# Patient Record
Sex: Female | Born: 1965 | Race: White | Hispanic: No | Marital: Married | Smoking: Current every day smoker
Health system: Southern US, Community
[De-identification: ages and names within clinical notes are randomized; demographics above are authoritative.]

## PROBLEM LIST (undated history)

## (undated) DIAGNOSIS — K259 Gastric ulcer, unspecified as acute or chronic, without hemorrhage or perforation: Secondary | ICD-10-CM

## (undated) DIAGNOSIS — G43909 Migraine, unspecified, not intractable, without status migrainosus: Secondary | ICD-10-CM

## (undated) DIAGNOSIS — I10 Essential (primary) hypertension: Secondary | ICD-10-CM

## (undated) DIAGNOSIS — J439 Emphysema, unspecified: Secondary | ICD-10-CM

## (undated) HISTORY — DX: Essential (primary) hypertension: I10

## (undated) HISTORY — DX: Migraine, unspecified, not intractable, without status migrainosus: G43.909

## (undated) HISTORY — DX: Emphysema, unspecified: J43.9

---

## 2010-09-09 ENCOUNTER — Observation Stay (HOSPITAL_COMMUNITY)
Admission: EM | Admit: 2010-09-09 | Discharge: 2010-09-10 | Payer: Self-pay | Source: Home / Self Care | Admitting: Emergency Medicine

## 2010-10-23 ENCOUNTER — Ambulatory Visit (HOSPITAL_COMMUNITY): Admission: RE | Admit: 2010-10-23 | Payer: Self-pay | Source: Home / Self Care | Admitting: Preventative Medicine

## 2010-11-14 ENCOUNTER — Encounter: Payer: Self-pay | Admitting: Preventative Medicine

## 2010-12-12 NOTE — Discharge Summary (Signed)
  NAMECHERRI, Madison NO.:  [id number]  MEDICAL RECORD NO.:  [id number]          PATIENT TYPE:  OBV  LOCATION:  A206                          FACILITY:  [facility]  PHYSICIAN:  Osvaldo Shipper, MD     DATE OF BIRTH:  02-08-1966  DATE OF ADMISSION:  09/09/2010 DATE OF DISCHARGE:  11/28/2011LH                              DISCHARGE SUMMARY   The patient left against medical advice the very same evening, just a few hours after admission.  Please review H and P dictated on the day of admission for details regarding the patient's presenting illness.  This was a 45 year old white female who presented to the hospital with complaints of palpitation and left shoulder pain.  She was being observed in the hospital for further evaluation of this palpitation. Echocardiogram was planned.  Shoulder x-ray was planned for her left shoulder pain.  However, patient changed her mind about admission and wanted to leave the hospital.  Since this evaluation was thought to be necessary especially as we were going to rule her out for ACS as well, we decided that she needed to stay.  The patient declined, and she left against medical advice.  No medications, etc., could be prescribed because the patient left AMA.    Osvaldo Shipper, MD     GK/MEDQ  D:  12/07/2010  T:  12/08/2010  Job:  [id number]  Electronically Signed by Osvaldo Shipper MD on 12/12/2010 03:04:38 PM

## 2010-12-26 LAB — CBC
HCT: 43.5 % (ref 36.0–46.0)
MCHC: 36.6 g/dL — ABNORMAL HIGH (ref 30.0–36.0)
Platelets: 179 10*3/uL (ref 150–400)
RDW: 13.1 % (ref 11.5–15.5)

## 2010-12-26 LAB — COMPREHENSIVE METABOLIC PANEL
ALT: 19 U/L (ref 0–35)
Albumin: 3.9 g/dL (ref 3.5–5.2)
BUN: 11 mg/dL (ref 6–23)
Calcium: 9 mg/dL (ref 8.4–10.5)
Glucose, Bld: 100 mg/dL — ABNORMAL HIGH (ref 70–99)
Sodium: 133 mEq/L — ABNORMAL LOW (ref 135–145)
Total Protein: 7.1 g/dL (ref 6.0–8.3)

## 2010-12-26 LAB — URINE CULTURE: Culture  Setup Time: 201111272205

## 2010-12-26 LAB — URINALYSIS, ROUTINE W REFLEX MICROSCOPIC
Bilirubin Urine: NEGATIVE
Leukocytes, UA: NEGATIVE
Nitrite: NEGATIVE
Specific Gravity, Urine: <1.005 — ABNORMAL LOW (ref 1.005–1.030)
Urobilinogen, UA: 0.2 mg/dL (ref 0.0–1.0)
pH: 6 (ref 5.0–8.0)

## 2010-12-26 LAB — URINE MICROSCOPIC-ADD ON

## 2010-12-26 LAB — DIFFERENTIAL
Lymphs Abs: 2.8 10*3/uL (ref 0.7–4.0)
Monocytes Relative: 6 % (ref 3–12)
Neutro Abs: 4 10*3/uL (ref 1.7–7.7)
Neutrophils Relative %: 55 % (ref 43–77)

## 2010-12-26 LAB — SALICYLATE LEVEL: Salicylate Lvl: <4 mg/dL (ref 2.8–20.0)

## 2010-12-26 LAB — POCT CARDIAC MARKERS
Myoglobin, poc: 36.6 ng/mL (ref 12–200)
Troponin i, poc: <0.05 ng/mL (ref 0.00–0.09)

## 2010-12-26 LAB — PROTIME-INR
INR: 0.85 (ref 0.00–1.49)
Prothrombin Time: 11.8 seconds (ref 11.6–15.2)

## 2010-12-26 LAB — ACETAMINOPHEN LEVEL: Acetaminophen (Tylenol), Serum: <10 ug/mL — ABNORMAL LOW (ref 10–30)

## 2014-06-09 ENCOUNTER — Encounter (HOSPITAL_COMMUNITY): Payer: Self-pay | Admitting: Emergency Medicine

## 2014-06-09 ENCOUNTER — Emergency Department (HOSPITAL_COMMUNITY)
Admission: EM | Admit: 2014-06-09 | Discharge: 2014-06-09 | Disposition: A | Discharge status: Home / Self Care | Payer: PRIVATE HEALTH INSURANCE | Attending: Emergency Medicine | Admitting: Emergency Medicine

## 2014-06-09 ENCOUNTER — Emergency Department (HOSPITAL_COMMUNITY): Payer: PRIVATE HEALTH INSURANCE

## 2014-06-09 DIAGNOSIS — S60219A Contusion of unspecified wrist, initial encounter: Secondary | ICD-10-CM | POA: Insufficient documentation

## 2014-06-09 DIAGNOSIS — W108XXA Fall (on) (from) other stairs and steps, initial encounter: Secondary | ICD-10-CM | POA: Diagnosis not present

## 2014-06-09 DIAGNOSIS — Y92009 Unspecified place in unspecified non-institutional (private) residence as the place of occurrence of the external cause: Secondary | ICD-10-CM | POA: Insufficient documentation

## 2014-06-09 DIAGNOSIS — Y9389 Activity, other specified: Secondary | ICD-10-CM | POA: Insufficient documentation

## 2014-06-09 DIAGNOSIS — S59919A Unspecified injury of unspecified forearm, initial encounter: Secondary | ICD-10-CM

## 2014-06-09 DIAGNOSIS — F17200 Nicotine dependence, unspecified, uncomplicated: Secondary | ICD-10-CM | POA: Diagnosis not present

## 2014-06-09 DIAGNOSIS — S6990XA Unspecified injury of unspecified wrist, hand and finger(s), initial encounter: Secondary | ICD-10-CM

## 2014-06-09 DIAGNOSIS — S59909A Unspecified injury of unspecified elbow, initial encounter: Secondary | ICD-10-CM | POA: Diagnosis present

## 2014-06-09 DIAGNOSIS — S60212A Contusion of left wrist, initial encounter: Secondary | ICD-10-CM

## 2014-06-09 MED ORDER — IBUPROFEN 800 MG PO TABS: 800.0000 mg | ORAL_TABLET | Freq: Three times a day (TID) | ORAL | Status: DC

## 2014-06-09 MED ORDER — IBUPROFEN 800 MG PO TABS
800.0000 mg | ORAL_TABLET | Freq: Once | ORAL | Status: AC
  Administered 2014-06-09: 800 mg via ORAL
  Filled 2014-06-09: qty 1

## 2014-06-09 NOTE — ED Notes (Signed)
Fell at home down stairs.

## 2014-06-09 NOTE — Discharge Instructions (Signed)
Contusion °A contusion is a deep bruise. Contusions happen when an injury causes bleeding under the skin. Signs of bruising include pain, puffiness (swelling), and discolored skin. The contusion may turn blue, purple, or yellow. °HOME CARE  °· Put ice on the injured area. °¨ Put ice in a plastic bag. °¨ Place a towel between your skin and the bag. °¨ Leave the ice on for 15-20 minutes, 03-04 times a day. °· Only take medicine as told by your doctor. °· Rest the injured area. °· If possible, raise (elevate) the injured area to lessen puffiness. °GET HELP RIGHT AWAY IF:  °· You have more bruising or puffiness. °· You have pain that is getting worse. °· Your puffiness or pain is not helped by medicine. °MAKE SURE YOU:  °· Understand these instructions. °· Will watch your condition. °· Will get help right away if you are not doing well or get worse. °Document Released: 03/18/2008 Document Revised: 12/23/2011 Document Reviewed: 08/05/2011 °Exit[employer]® Patient Information ©2015 [employer]. This information is not intended to replace advice given to you by your health care provider. Make sure you discuss any questions you have with your health care provider. ° °

## 2014-06-11 NOTE — ED Provider Notes (Signed)
CSN: [id number]     Arrival date & time 06/09/14  1943 History   First MD Initiated Contact with Patient 06/09/14 2132     Chief Complaint  Patient presents with  . Fall     (Consider location/radiation/quality/duration/timing/severity/associated sxs/prior Treatment) Patient is a 48 y.o. female presenting with fall.  Fall Associated symptoms include arthralgias and joint swelling. Pertinent negatives include no abdominal pain, chest pain, chills, fever, headaches, neck pain, numbness, vomiting or weakness.   JANYA EVELAND is a 48 y.o. female who presents to the Emergency Department complaining of left forearm pain and swelling after falling down several steps.  She states that her left arm struck a wall as she was trying to prevent her fall.  She has been applying ice with moderate improvement.  She denies head injury, neck or back pain , dizziness or LOC.  She also denies numbness or weakness of her arm or fingers   History reviewed. No pertinent past medical history. History reviewed. No pertinent past surgical history. No family history on file. History  Substance Use Topics  . Smoking status: Current Every Day Smoker -- 2.00 packs/day  . Smokeless tobacco: Not on file  . Alcohol Use: Not on file   OB History   Grav Para Term Preterm Abortions TAB SAB Ect Mult Living                 Review of Systems  Constitutional: Negative for fever and chills.  Cardiovascular: Negative for chest pain.  Gastrointestinal: Negative for vomiting and abdominal pain.  Genitourinary: Negative for dysuria and difficulty urinating.  Musculoskeletal: Positive for arthralgias and joint swelling. Negative for back pain and neck pain.  Skin: Negative for color change and wound.  Neurological: Negative for dizziness, syncope, weakness, numbness and headaches.  All other systems reviewed and are negative.     Allergies  Review of patient's allergies indicates no known allergies.  Home  Medications   Prior to Admission medications   Medication Sig Start Date End Date Taking? Authorizing Provider  ibuprofen (ADVIL,MOTRIN) 800 MG tablet Take 1 tablet (800 mg total) by mouth 3 (three) times daily. 06/09/14   Evalina Tabak L. Hiliana Eilts, PA-C   BP 159/101  Pulse 88  Temp(Src) 98.4 F (36.9 C) (Oral)  Resp 22  Ht  (1.702 m)  Wt 185 lb (83.915 kg)  BMI 28.97 kg/m2  SpO2 99%  LMP 05/26/2014 Physical Exam  Nursing note and vitals reviewed. Constitutional: She is oriented to person, place, and time. She appears well-developed and well-nourished. No distress.  HENT:  Head: Normocephalic and atraumatic.  Neck: Normal range of motion. Neck supple.  Cardiovascular: Normal rate, regular rhythm, normal heart sounds and intact distal pulses.   No murmur heard. Pulmonary/Chest: Effort normal and breath sounds normal. No respiratory distress. She exhibits no tenderness.  Musculoskeletal: She exhibits edema and tenderness.  Localized ttp and STS to the proximal left wrist.   Radial pulse is brisk, distal sensation intact.  CR< 2 sec.  No bruising or bony deformity.  Patient has full ROM of the fingers and left elbow.   Compartments of the left arm are soft.  Neurological: She is alert and oriented to person, place, and time. She exhibits normal muscle tone. Coordination normal.  Skin: Skin is warm and dry.    ED Course  Procedures (including critical care time) Labs Review Labs Reviewed - No data to display  Imaging Review Dg Wrist Complete Left  06/09/2014   CLINICAL  DATA:  Left wrist pain. Swelling to the distal posterior forearm. Fall.  EXAM: LEFT WRIST - COMPLETE 3+ VIEW  COMPARISON:  None.  FINDINGS: No evidence of acute fracture or subluxation. No focal bone lesion or bone destruction. Bone cortex and trabecular architecture appear intact. No radiopaque soft tissue foreign bodies. Soft tissue swelling over the dorsal aspect of the distal forearm.  IMPRESSION: Soft tissue  swelling.  No acute bony abnormalities.   Electronically Signed   By: Burman Nieves M.D.   On: 06/09/2014 21:06    EKG Interpretation None      MDM   Final diagnoses:  Contusion, wrist, left, initial encounter    Pt with localized STS of the distal left forearm.  No bony deformity on exam.  XR neg for fracture, pt has full ROM of the left wrist and elbow.  Likely contusion, she agrees to ice, elevate, and given orthopedic referral if sx's not improving in one week.  She agrees to plan, Rx for ibuprofen.  She appears stable for d/c    Evie Crumpler L. Trisha Mangle, PA-C 06/11/14 2344

## 2014-06-13 NOTE — ED Provider Notes (Signed)
Medical screening examination/treatment/procedure(s) were performed by non-physician practitioner and as supervising physician I was immediately available for consultation/collaboration.   EKG Interpretation None        Layla Maw Izaia Say, DO 06/13/14 1540

## 2015-02-28 ENCOUNTER — Emergency Department (HOSPITAL_COMMUNITY): Payer: PRIVATE HEALTH INSURANCE

## 2015-02-28 ENCOUNTER — Emergency Department (HOSPITAL_COMMUNITY)
Admission: EM | Admit: 2015-02-28 | Discharge: 2015-02-28 | Disposition: A | Discharge status: Home / Self Care | Payer: PRIVATE HEALTH INSURANCE | Attending: Emergency Medicine | Admitting: Emergency Medicine

## 2015-02-28 ENCOUNTER — Encounter (HOSPITAL_COMMUNITY): Payer: Self-pay | Admitting: Emergency Medicine

## 2015-02-28 DIAGNOSIS — Z3202 Encounter for pregnancy test, result negative: Secondary | ICD-10-CM | POA: Insufficient documentation

## 2015-02-28 DIAGNOSIS — N390 Urinary tract infection, site not specified: Secondary | ICD-10-CM | POA: Insufficient documentation

## 2015-02-28 DIAGNOSIS — R52 Pain, unspecified: Secondary | ICD-10-CM

## 2015-02-28 DIAGNOSIS — Z9889 Other specified postprocedural states: Secondary | ICD-10-CM | POA: Insufficient documentation

## 2015-02-28 DIAGNOSIS — Z720 Tobacco use: Secondary | ICD-10-CM | POA: Diagnosis not present

## 2015-02-28 DIAGNOSIS — R109 Unspecified abdominal pain: Secondary | ICD-10-CM | POA: Diagnosis present

## 2015-02-28 LAB — CBC WITH DIFFERENTIAL/PLATELET
BASOS ABS: 0 10*3/uL (ref 0.0–0.1)
BASOS PCT: 0 % (ref 0–1)
EOS ABS: 0 10*3/uL (ref 0.0–0.7)
EOS PCT: 0 % (ref 0–5)
HCT: 45.3 % (ref 36.0–46.0)
Hemoglobin: 15.9 g/dL — ABNORMAL HIGH (ref 12.0–15.0)
Lymphocytes Relative: 17 % (ref 12–46)
Lymphs Abs: 2.1 10*3/uL (ref 0.7–4.0)
MCH: 31.5 pg (ref 26.0–34.0)
MCHC: 35.1 g/dL (ref 30.0–36.0)
MCV: 89.7 fL (ref 78.0–100.0)
Monocytes Absolute: 0.6 10*3/uL (ref 0.1–1.0)
Monocytes Relative: 4 % (ref 3–12)
Neutro Abs: 9.7 10*3/uL — ABNORMAL HIGH (ref 1.7–7.7)
Neutrophils Relative %: 79 % — ABNORMAL HIGH (ref 43–77)
PLATELETS: 223 10*3/uL (ref 150–400)
RBC: 5.05 MIL/uL (ref 3.87–5.11)
RDW: 13.5 % (ref 11.5–15.5)
WBC: 12.4 10*3/uL — AB (ref 4.0–10.5)

## 2015-02-28 LAB — COMPREHENSIVE METABOLIC PANEL
ALK PHOS: 60 U/L (ref 38–126)
ALT: 23 U/L (ref 14–54)
ANION GAP: 12 (ref 5–15)
AST: 11 U/L — ABNORMAL LOW (ref 15–41)
Albumin: 4.4 g/dL (ref 3.5–5.0)
BUN: 17 mg/dL (ref 6–20)
CALCIUM: 10.3 mg/dL (ref 8.9–10.3)
CO2: 24 mmol/L (ref 22–32)
CREATININE: 0.7 mg/dL (ref 0.44–1.00)
Chloride: 103 mmol/L (ref 101–111)
GFR calc non Af Amer: >60 mL/min (ref >60)
GLUCOSE: 124 mg/dL — AB (ref 65–99)
POTASSIUM: 4.2 mmol/L (ref 3.5–5.1)
Sodium: 139 mmol/L (ref 135–145)
TOTAL PROTEIN: 7.9 g/dL (ref 6.5–8.1)
Total Bilirubin: 0.4 mg/dL (ref 0.3–1.2)

## 2015-02-28 LAB — URINALYSIS, ROUTINE W REFLEX MICROSCOPIC
Bilirubin Urine: NEGATIVE
Glucose, UA: NEGATIVE mg/dL
KETONES UR: NEGATIVE mg/dL
NITRITE: POSITIVE — AB
PROTEIN: NEGATIVE mg/dL
Specific Gravity, Urine: 1.015 (ref 1.005–1.030)
UROBILINOGEN UA: 0.2 mg/dL (ref 0.0–1.0)
pH: 5.5 (ref 5.0–8.0)

## 2015-02-28 LAB — URINE MICROSCOPIC-ADD ON

## 2015-02-28 LAB — PREGNANCY, URINE: PREG TEST UR: NEGATIVE

## 2015-02-28 LAB — LIPASE, BLOOD: Lipase: 20 U/L — ABNORMAL LOW (ref 22–51)

## 2015-02-28 MED ORDER — PANTOPRAZOLE SODIUM 20 MG PO TBEC: 20.0000 mg | DELAYED_RELEASE_TABLET | Freq: Every day | ORAL | Status: DC

## 2015-02-28 MED ORDER — PANTOPRAZOLE SODIUM 40 MG IV SOLR
40.0000 mg | Freq: Once | INTRAVENOUS | Status: AC
  Administered 2015-02-28: 40 mg via INTRAVENOUS
  Filled 2015-02-28: qty 40

## 2015-02-28 MED ORDER — TRAMADOL HCL 50 MG PO TABS
50.0000 mg | ORAL_TABLET | Freq: Four times a day (QID) | ORAL | Status: DC | PRN
Start: 2015-02-28 — End: 2015-10-26

## 2015-02-28 MED ORDER — LEVOFLOXACIN 500 MG PO TABS: 500.0000 mg | ORAL_TABLET | Freq: Every day | ORAL | Status: DC

## 2015-02-28 MED ORDER — SODIUM CHLORIDE 0.9 % IV BOLUS (SEPSIS)
1000.0000 mL | Freq: Once | INTRAVENOUS | Status: AC
  Administered 2015-02-28: 1000 mL via INTRAVENOUS

## 2015-02-28 MED ORDER — LEVOFLOXACIN 500 MG PO TABS
500.0000 mg | ORAL_TABLET | Freq: Once | ORAL | Status: AC
  Administered 2015-02-28: 500 mg via ORAL
  Filled 2015-02-28: qty 1

## 2015-02-28 MED ORDER — ONDANSETRON HCL 4 MG/2ML IJ SOLN
4.0000 mg | Freq: Once | INTRAMUSCULAR | Status: AC
  Administered 2015-02-28: 4 mg via INTRAVENOUS
  Filled 2015-02-28: qty 2

## 2015-02-28 NOTE — ED Provider Notes (Signed)
CSN: [id number]     Arrival date & time 02/28/15  1622 History   First MD Initiated Contact with Patient 02/28/15 1745     Chief Complaint  Patient presents with  . Abdominal Pain     (Consider location/radiation/quality/duration/timing/severity/associated sxs/prior Treatment) Patient is a 49 y.o. female presenting with abdominal pain. The history is provided by the patient (the pt complains of a sinus infection and now has abd pain.  she has been taking 12 motrins at a time along with 4 grams of amox a day).  Abdominal Pain Pain location:  Epigastric Pain quality: aching   Pain radiates to:  Does not radiate Pain severity:  Mild Onset quality:  Gradual Timing:  Intermittent Progression:  Waxing and waning Chronicity:  New Context: not alcohol use   Associated symptoms: no chest pain, no cough, no diarrhea, no fatigue and no hematuria     History reviewed. No pertinent past medical history. Past Surgical History  Procedure Laterality Date  . Cesarean section     History reviewed. No pertinent family history. History  Substance Use Topics  . Smoking status: Current Every Day Smoker -- 1.00 packs/day    Types: Cigarettes  . Smokeless tobacco: Not on file  . Alcohol Use: No   OB History    Gravida Para Term Preterm AB TAB SAB Ectopic Multiple Living            5     Review of Systems  Constitutional: Negative for appetite change and fatigue.  HENT: Negative for congestion, ear discharge and sinus pressure.   Eyes: Negative for discharge.  Respiratory: Negative for cough.   Cardiovascular: Negative for chest pain.  Gastrointestinal: Positive for abdominal pain. Negative for diarrhea.  Genitourinary: Negative for frequency and hematuria.  Musculoskeletal: Negative for back pain.  Skin: Negative for rash.  Neurological: Negative for seizures and headaches.  Psychiatric/Behavioral: Negative for hallucinations.      Allergies  Review of patient's allergies indicates  no known allergies.  Home Medications   Prior to Admission medications   Medication Sig Start Date End Date Taking? Authorizing Provider  GLUCOSAMINE-CHONDROITIN PO Take 1 tablet by mouth daily.   Yes Historical Provider, MD  Multiple Vitamins-Minerals (CENTRUM) tablet Take 1 tablet by mouth daily.   Yes Historical Provider, MD  ibuprofen (ADVIL,MOTRIN) 800 MG tablet Take 1 tablet (800 mg total) by mouth 3 (three) times daily. Patient not taking: Reported on 02/28/2015 06/09/14   Tammy Triplett, PA-C  levofloxacin (LEVAQUIN) 500 MG tablet Take 1 tablet (500 mg total) by mouth daily. 02/28/15   Bethann BerkshireJoseph Nautica Hotz, MD  pantoprazole (PROTONIX) 20 MG tablet Take 1 tablet (20 mg total) by mouth daily. 02/28/15   Bethann BerkshireJoseph Tamya Denardo, MD  traMADol (ULTRAM) 50 MG tablet Take 1 tablet (50 mg total) by mouth every 6 (six) hours as needed. 02/28/15   Bethann BerkshireJoseph Sorina Derrig, MD   BP 134/80 mmHg  Pulse 81  Temp(Src) 97.9 F (36.6 C) (Oral)  Resp 16  Ht 5\' 7"  (1.702 m)  Wt 200 lb (90.719 kg)  BMI 31.32 kg/m2  SpO2 97%  LMP 05/26/2014 Physical Exam  Constitutional: She is oriented to person, place, and time. She appears well-developed.  HENT:  Head: Normocephalic.  Eyes: Conjunctivae and EOM are normal. No scleral icterus.  Neck: Neck supple. No thyromegaly present.  Cardiovascular: Normal rate and regular rhythm.  Exam reveals no gallop and no friction rub.   No murmur heard. Pulmonary/Chest: No stridor. She has no wheezes. She has  no rales. She exhibits no tenderness.  Abdominal: She exhibits no distension. There is tenderness. There is no rebound.  Moderate tenderness to epigastric  Musculoskeletal: Normal range of motion. She exhibits no edema.  Lymphadenopathy:    She has no cervical adenopathy.  Neurological: She is oriented to person, place, and time. She exhibits normal muscle tone. Coordination normal.  Skin: No rash noted. No erythema.  Psychiatric: She has a normal mood and affect. Her behavior is normal.     ED Course  Procedures (including critical care time) Labs Review Labs Reviewed  URINALYSIS, ROUTINE W REFLEX MICROSCOPIC - Abnormal; Notable for the following:    APPearance HAZY (*)    Hgb urine dipstick MODERATE (*)    Nitrite POSITIVE (*)    Leukocytes, UA MODERATE (*)    All other components within normal limits  CBC WITH DIFFERENTIAL/PLATELET - Abnormal; Notable for the following:    WBC 12.4 (*)    Hemoglobin 15.9 (*)    Neutrophils Relative % 79 (*)    Neutro Abs 9.7 (*)    All other components within normal limits  COMPREHENSIVE METABOLIC PANEL - Abnormal; Notable for the following:    Glucose, Bld 124 (*)    AST 11 (*)    All other components within normal limits  LIPASE, BLOOD - Abnormal; Notable for the following:    Lipase 20 (*)    All other components within normal limits  URINE MICROSCOPIC-ADD ON - Abnormal; Notable for the following:    Squamous Epithelial / LPF MANY (*)    Bacteria, UA MANY (*)    All other components within normal limits  URINE CULTURE  PREGNANCY, URINE    Imaging Review Dg Abd Acute W/chest  02/28/2015   CLINICAL DATA:  Sinus infection and pressure with body aches since 02/22/2015, vomiting and abdominal pain for past 3 days, has been taking amoxicillin and ibuprofen, history smoking  EXAM: DG ABDOMEN ACUTE W/ 1V CHEST  COMPARISON:  Chest radiograph 09/09/2010  FINDINGS: Normal heart size, mediastinal contours, and pulmonary vascularity.  Lungs appear emphysematous but clear.  No pleural effusion or pneumothorax.  Bowel gas pattern normal.  No bowel dilatation or bowel wall thickening or free intraperitoneal air.  Osseous structures unremarkable.  No urinary tract calcification.  IMPRESSION: Emphysematous changes.  No acute abdominal findings.   Electronically Signed   By: Ulyses SouthwardMark  Boles M.D.   On: 02/28/2015 20:26     EKG Interpretation None      MDM   Final diagnoses:  Pain  Abdominal pain in female  UTI (lower urinary tract  infection)    Uti,  tx with levaquin,  abd pain pt rx protonix and ultram, stop motrin,  Pt to follow up with a family md for htn and recheck.  She is to return for us abd  Tomorrow.  If us neg she can continue protonix, ultram and levaquing    Bethann BerkshireJoseph Concetta Guion, MD 02/28/15 2135

## 2015-02-28 NOTE — Discharge Instructions (Signed)
Follow up tomorrow for ultrasound of abdomen.  Follow up with a family md later this week for recheck of pain and blood pressure.  Your bp has been high in the er

## 2015-02-28 NOTE — ED Notes (Signed)
Patient is scheduled for US tomorrow 03/01/15 at 12pm, given NPO instructions and orders for US, patient verbally understands.

## 2015-02-28 NOTE — ED Notes (Addendum)
PT c/o sinus infection and pressure with body aches since 02/22/15 and states taking lots of ibuprofen then days after started taking amoxicillin she ordered off the Internet and started having vomiting and abdominal pain for 3 days. PT denies any urinary symptoms.

## 2015-03-01 ENCOUNTER — Ambulatory Visit (HOSPITAL_COMMUNITY): Admit: 2015-03-01 | Payer: PRIVATE HEALTH INSURANCE

## 2015-03-01 ENCOUNTER — Other Ambulatory Visit (HOSPITAL_COMMUNITY): Payer: Self-pay | Admitting: Emergency Medicine

## 2015-03-01 DIAGNOSIS — R1084 Generalized abdominal pain: Secondary | ICD-10-CM

## 2015-03-03 LAB — URINE CULTURE: Colony Count: >=100000

## 2015-03-04 ENCOUNTER — Telehealth: Payer: Self-pay | Admitting: Emergency Medicine

## 2015-03-04 NOTE — Telephone Encounter (Signed)
Post ED Visit - Positive Culture Follow-up  Culture report reviewed by antimicrobial stewardship pharmacist: []  Wes Dulaney, Pharm.D., BCPS []  Celedonio MiyamotoJeremy Frens, [address] BCPS []  Georgina PillionElizabeth Martin, [address] BCPS [x]  OcillaMinh Pham, [address] BCPS, AAHIVP []  Estella HuskMichelle Snee, Pharm.D., BCPS, AAHIVP []  Elder CyphersLorie Poole, [address] BCPS  Positive Urine culture Treated with Levofloxacin, organism sensitive to the same and no further patient follow-up is required at this time.  Jiles HaroldGammons, Ashlynn Gunnels Chaney 03/04/2015, 5:45 PM

## 2015-03-18 ENCOUNTER — Emergency Department (HOSPITAL_COMMUNITY)
Admission: EM | Admit: 2015-03-18 | Discharge: 2015-03-18 | Disposition: A | Discharge status: Home / Self Care | Payer: PRIVATE HEALTH INSURANCE | Attending: Emergency Medicine | Admitting: Emergency Medicine

## 2015-03-18 ENCOUNTER — Encounter (HOSPITAL_COMMUNITY): Payer: Self-pay | Admitting: *Deleted

## 2015-03-18 ENCOUNTER — Emergency Department (HOSPITAL_COMMUNITY): Payer: PRIVATE HEALTH INSURANCE

## 2015-03-18 DIAGNOSIS — Z792 Long term (current) use of antibiotics: Secondary | ICD-10-CM | POA: Insufficient documentation

## 2015-03-18 DIAGNOSIS — R112 Nausea with vomiting, unspecified: Secondary | ICD-10-CM | POA: Insufficient documentation

## 2015-03-18 DIAGNOSIS — Z791 Long term (current) use of non-steroidal anti-inflammatories (NSAID): Secondary | ICD-10-CM | POA: Insufficient documentation

## 2015-03-18 DIAGNOSIS — Z720 Tobacco use: Secondary | ICD-10-CM | POA: Insufficient documentation

## 2015-03-18 DIAGNOSIS — R0789 Other chest pain: Secondary | ICD-10-CM | POA: Diagnosis not present

## 2015-03-18 DIAGNOSIS — R079 Chest pain, unspecified: Secondary | ICD-10-CM | POA: Diagnosis present

## 2015-03-18 DIAGNOSIS — Z79899 Other long term (current) drug therapy: Secondary | ICD-10-CM | POA: Diagnosis not present

## 2015-03-18 LAB — CBC WITH DIFFERENTIAL/PLATELET
BASOS ABS: 0 10*3/uL (ref 0.0–0.1)
BASOS PCT: 0 % (ref 0–1)
EOS PCT: 2 % (ref 0–5)
Eosinophils Absolute: 0.1 10*3/uL (ref 0.0–0.7)
HCT: 42.5 % (ref 36.0–46.0)
Hemoglobin: 14.6 g/dL (ref 12.0–15.0)
Lymphocytes Relative: 27 % (ref 12–46)
Lymphs Abs: 2.6 10*3/uL (ref 0.7–4.0)
MCH: 30.9 pg (ref 26.0–34.0)
MCHC: 34.4 g/dL (ref 30.0–36.0)
MCV: 89.9 fL (ref 78.0–100.0)
Monocytes Absolute: 0.5 10*3/uL (ref 0.1–1.0)
Monocytes Relative: 5 % (ref 3–12)
Neutro Abs: 6.2 10*3/uL (ref 1.7–7.7)
Neutrophils Relative %: 66 % (ref 43–77)
Platelets: 285 10*3/uL (ref 150–400)
RBC: 4.73 MIL/uL (ref 3.87–5.11)
RDW: 13.1 % (ref 11.5–15.5)
WBC: 9.4 10*3/uL (ref 4.0–10.5)

## 2015-03-18 LAB — COMPREHENSIVE METABOLIC PANEL
ALT: 15 U/L (ref 14–54)
ANION GAP: 12 (ref 5–15)
AST: 10 U/L — ABNORMAL LOW (ref 15–41)
Albumin: 4.2 g/dL (ref 3.5–5.0)
Alkaline Phosphatase: 70 U/L (ref 38–126)
BILIRUBIN TOTAL: 0.4 mg/dL (ref 0.3–1.2)
BUN: 11 mg/dL (ref 6–20)
CHLORIDE: 101 mmol/L (ref 101–111)
CO2: 24 mmol/L (ref 22–32)
Calcium: 9.7 mg/dL (ref 8.9–10.3)
Creatinine, Ser: 0.71 mg/dL (ref 0.44–1.00)
Glucose, Bld: 115 mg/dL — ABNORMAL HIGH (ref 65–99)
Potassium: 4 mmol/L (ref 3.5–5.1)
Sodium: 137 mmol/L (ref 135–145)
TOTAL PROTEIN: 7.9 g/dL (ref 6.5–8.1)

## 2015-03-18 LAB — D-DIMER, QUANTITATIVE (NOT AT ARMC): D DIMER QUANT: 0.32 ug{FEU}/mL (ref 0.00–0.48)

## 2015-03-18 LAB — TROPONIN I: Troponin I: <0.03 ng/mL (ref <0.031)

## 2015-03-18 MED ORDER — SODIUM CHLORIDE 0.9 % IV BOLUS (SEPSIS)
1000.0000 mL | Freq: Once | INTRAVENOUS | Status: AC
  Administered 2015-03-18: 1000 mL via INTRAVENOUS

## 2015-03-18 MED ORDER — HYDROMORPHONE HCL 1 MG/ML IJ SOLN
1.0000 mg | Freq: Once | INTRAMUSCULAR | Status: AC
  Administered 2015-03-18: 1 mg via INTRAVENOUS
  Filled 2015-03-18: qty 1

## 2015-03-18 MED ORDER — PREDNISONE 20 MG PO TABS: 60.0000 mg | ORAL_TABLET | Freq: Every day | ORAL | Status: AC

## 2015-03-18 MED ORDER — PREDNISONE 50 MG PO TABS
60.0000 mg | ORAL_TABLET | Freq: Once | ORAL | Status: AC
  Administered 2015-03-18: 60 mg via ORAL
  Filled 2015-03-18 (×2): qty 1

## 2015-03-18 NOTE — Discharge Instructions (Signed)
As discussed, your evaluation today has been largely reassuring.  But, it is important that you monitor your condition carefully, and do not hesitate to return to the ED if you develop new, or concerning changes in your condition. ? ?Otherwise, please follow-up with your physician for appropriate ongoing care. ? ?

## 2015-03-18 NOTE — ED Notes (Signed)
Intermittent CP x 4 days. Hurts worse with movement or in certain positions. Pain to left chest. Left shoulder and upper back. N/V/D. Last vomited yesterday. Last had diarrhea this morning. Pt was here on the 17th form abdominal pain and was told to come back the next day for abd US and pt refused. Pt has an appt to see Dr. Margo AyeHall on Thursday.

## 2015-03-18 NOTE — ED Provider Notes (Signed)
CSN: [id number]     Arrival date & time 03/18/15  1411 History   First MD Initiated Contact with Patient 03/18/15 1419     Chief Complaint  Patient presents with  . Chest Pain     (Consider location/radiation/quality/duration/timing/severity/associated sxs/prior Treatment) HPI Patient presents with concern of chest pain, worse with activity. She actually states the pain is between the scapula, radiating anteriorly to the chest. There is associated nausea, vomiting, though this has been present for 2 weeks, not notably changed after initiating new medication following emergency department evaluation. No new fever, weakness in any extremity, syncope. Patient smokes. She states that she has not seen primary care in almost 20 years, but has a primary care visit scheduled this week.   Smoking cessation provided, particularly in light of this patient's evaluation in the ED.    History reviewed. No pertinent past medical history. Past Surgical History  Procedure Laterality Date  . Cesarean section     No family history on file. History  Substance Use Topics  . Smoking status: Current Every Day Smoker -- 1.00 packs/day    Types: Cigarettes  . Smokeless tobacco: Not on file  . Alcohol Use: No   OB History    Gravida Para Term Preterm AB TAB SAB Ectopic Multiple Living            5     Review of Systems  Constitutional:       Per HPI, otherwise negative  HENT:       Per HPI, otherwise negative  Respiratory:       Per HPI, otherwise negative  Cardiovascular:       Per HPI, otherwise negative  Gastrointestinal: Negative for vomiting.  Endocrine:       Negative aside from HPI  Genitourinary:       Neg aside from HPI   Musculoskeletal:       Per HPI, otherwise negative  Skin: Negative.   Neurological: Negative for syncope.      Allergies  Review of patient's allergies indicates no known allergies.  Home Medications   Prior to Admission medications   Medication  Sig Start Date End Date Taking? Authorizing Provider  acetaminophen (TYLENOL) 500 MG tablet Take 1,000 mg by mouth every 6 (six) hours as needed.   Yes Historical Provider, MD  alum & mag hydroxide-simeth (MAALOX/MYLANTA) 200-200-20 MG/5ML suspension Take 30 mLs by mouth every 6 (six) hours as needed for indigestion or heartburn.   Yes Historical Provider, MD  bismuth subsalicylate (PEPTO BISMOL) 262 MG chewable tablet Chew 524 mg by mouth as needed.   Yes Historical Provider, MD  levofloxacin (LEVAQUIN) 500 MG tablet Take 1 tablet (500 mg total) by mouth daily. 02/28/15  Yes Bethann BerkshireJoseph Zammit, MD  pantoprazole (PROTONIX) 20 MG tablet Take 1 tablet (20 mg total) by mouth daily. 02/28/15  Yes Bethann BerkshireJoseph Zammit, MD  traMADol (ULTRAM) 50 MG tablet Take 1 tablet (50 mg total) by mouth every 6 (six) hours as needed. 02/28/15  Yes Bethann BerkshireJoseph Zammit, MD  ibuprofen (ADVIL,MOTRIN) 800 MG tablet Take 1 tablet (800 mg total) by mouth 3 (three) times daily. Patient not taking: Reported on 02/28/2015 06/09/14   Tammy Triplett, PA-C   BP 125/89 mmHg  Pulse 65  Temp(Src) 97.5 F (36.4 C) (Oral)  Resp 16  SpO2 90%  LMP 05/26/2014 Physical Exam  Constitutional: She is oriented to person, place, and time. She appears well-developed and well-nourished. No distress.  HENT:  Head: Normocephalic and atraumatic.  Eyes: Conjunctivae and EOM are normal.  Cardiovascular: Normal rate and regular rhythm.   Pulmonary/Chest: Effort normal and breath sounds normal. No stridor. No respiratory distress.  Abdominal: She exhibits no distension.  Musculoskeletal: She exhibits no edema.  Neurological: She is alert and oriented to person, place, and time. No cranial nerve deficit.  Skin: Skin is warm and dry.  Psychiatric: She has a normal mood and affect.  Nursing note and vitals reviewed.   ED Course  Procedures (including critical care time) Labs Review Labs Reviewed  COMPREHENSIVE METABOLIC PANEL - Abnormal; Notable for the  following:    Glucose, Bld 115 (*)    AST 10 (*)    All other components within normal limits  CBC WITH DIFFERENTIAL/PLATELET  D-DIMER, QUANTITATIVE (NOT AT [facility])  TROPONIN I    Imaging Review Dg Chest 2 View  03/18/2015   CLINICAL DATA:  Intermittent chest pain over the last 4 days. Pain is positional. Vomiting and diarrhea.  EXAM: CHEST - 2 VIEW  COMPARISON:  Acute abdominal series 02/28/2015  FINDINGS: Heart size is normal. Chronic interstitial coarsening is similar to the prior exam. No focal airspace disease is present. There is no edema or effusion to suggest failure. The visualized soft tissues and bony thorax are unremarkable.  IMPRESSION: 1. No acute cardiopulmonary disease. 2. Chronic interstitial coarsening is stable.   Electronically Signed   By: Marin Roberts M.D.   On: 03/18/2015 16:11     EKG Interpretation   Date/Time:  Saturday March 18 2015 14:22:05 EDT Ventricular Rate:  90 PR Interval:  150 QRS Duration: 85 QT Interval:  365 QTC Calculation: 447 R Axis:   54 Text Interpretation:  Sinus rhythm Sinus rhythm Artifact Borderline ECG  Confirmed by Gerhard Munch  MD [id number]) on 03/18/2015 3:17:16 PM     Chart review demonstrates visit to the emergency department 2 weeks ago for nausea, vomiting, and initiation of gastric medication.   4:36 PM Patient sitting upright, in NAD.  We discussed all findings, and the need to keep her PMD visit in four days.  MDM   Patient Madison Benjamin with ongoing chest pain. Patient discussion of pain is worse with positioning, motion suggests inflammatory etiology. Here the patient is not notably hypoxic, tachypneic, tachycardic, and negative dimers reassuring for the low suspicion of PE, dissection. Patient is symmetric bilateral pulses, no loss of sensation or function in either upper extremity. No evidence for ongoing coronary ischemia. Given the patient's smoking history, additional evaluation with pulmonology or and primary care is  reasonable. Patient treated empirically for inflammatory disorder, discharged in stable condition.     Gerhard Munch, MD 03/18/15 [id number]

## 2015-03-23 ENCOUNTER — Other Ambulatory Visit (HOSPITAL_COMMUNITY): Payer: Self-pay | Admitting: Internal Medicine

## 2015-03-23 ENCOUNTER — Ambulatory Visit (HOSPITAL_COMMUNITY)
Admission: RE | Admit: 2015-03-23 | Discharge: 2015-03-23 | Disposition: A | Discharge status: Home / Self Care | Payer: PRIVATE HEALTH INSURANCE | Source: Ambulatory Visit | Attending: Internal Medicine | Admitting: Internal Medicine

## 2015-03-23 DIAGNOSIS — R109 Unspecified abdominal pain: Secondary | ICD-10-CM

## 2015-03-23 DIAGNOSIS — R100 Acute abdomen: Secondary | ICD-10-CM | POA: Insufficient documentation

## 2015-03-23 DIAGNOSIS — R112 Nausea with vomiting, unspecified: Secondary | ICD-10-CM | POA: Diagnosis not present

## 2015-04-13 ENCOUNTER — Ambulatory Visit: Payer: Medicare (Managed Care) | Admitting: Gastroenterology

## 2015-04-13 ENCOUNTER — Telehealth: Payer: Self-pay | Admitting: General Practice

## 2015-04-13 NOTE — Telephone Encounter (Signed)
I called the patient to reschedule his appt for today due to a power outage at the office, no answer and I was unable to leave a message

## 2015-04-14 ENCOUNTER — Encounter: Payer: Self-pay | Admitting: Gastroenterology

## 2015-05-05 ENCOUNTER — Ambulatory Visit (INDEPENDENT_AMBULATORY_CARE_PROVIDER_SITE_OTHER): Payer: PRIVATE HEALTH INSURANCE | Admitting: Gastroenterology

## 2015-05-05 ENCOUNTER — Encounter: Payer: Self-pay | Admitting: Gastroenterology

## 2015-05-05 VITALS — BP 125/80 | HR 76 | Temp 98.2°F | Ht 67.0 in | Wt 198.0 lb

## 2015-05-05 DIAGNOSIS — K838 Other specified diseases of biliary tract: Secondary | ICD-10-CM

## 2015-05-05 DIAGNOSIS — R197 Diarrhea, unspecified: Secondary | ICD-10-CM

## 2015-05-05 DIAGNOSIS — K219 Gastro-esophageal reflux disease without esophagitis: Secondary | ICD-10-CM | POA: Diagnosis not present

## 2015-05-05 DIAGNOSIS — R112 Nausea with vomiting, unspecified: Secondary | ICD-10-CM

## 2015-05-05 DIAGNOSIS — R1011 Right upper quadrant pain: Secondary | ICD-10-CM | POA: Diagnosis not present

## 2015-05-05 MED ORDER — ESOMEPRAZOLE MAGNESIUM 40 MG PO CPDR: 40.0000 mg | DELAYED_RELEASE_CAPSULE | Freq: Every day | ORAL | Status: DC

## 2015-05-05 MED ORDER — SUCRALFATE 1 G PO TABS: 1.0000 g | ORAL_TABLET | Freq: Three times a day (TID) | ORAL | Status: DC

## 2015-05-05 NOTE — Patient Instructions (Signed)
1. Please collect stool studies when you return next week. They will only accept loose stools. 2. Please continue Nexium and Carafate for now. RX sent to your pharmacy. 3. Upper endoscopy with Dr. Darrick Penna. See separate instructions.  4. I will discuss dilated bile duct with Dr. Darrick Penna, further recommendations to follow.

## 2015-05-05 NOTE — Assessment & Plan Note (Signed)
49 year old female with two-month history of acute onset right upper quadrant abdominal pain associated with vomiting and diarrhea with workup revealing prominent common bile duct of 7.7 mm. No gallstones seen on ultrasound. Symptoms are now improved on Nexium and Carafate but she has been unable to come off of the Carafate. Suspect gastritis, peptic ulcer disease but still cannot exclude biliary etiology although it is reassuring that her LFTs were normal multiple occasions. She still may need to have further imaging of her bile duct possibly be a MRCP, we'll discuss further with Dr. Darrick Penna. At this time recommend upper endoscopy for further evaluation of acute onset upper abdominal pain in the setting of extensive NSAID use, to rule out peptic ulcer disease.  I have discussed the risks, alternatives, benefits with regards to but not limited to the risk of reaction to medication, bleeding, infection, perforation and the patient is agreeable to proceed. Written consent to be obtained.  She'll continue Nexium 40 mg daily. Continue Carafate for now. Prescriptions provided. Continue to avoid NSAIDs.

## 2015-05-05 NOTE — Progress Notes (Signed)
Primary Care Physician:  Catalina Pizza, MD  Primary Gastroenterologist:  Jonette Eva, MD   Chief Complaint  Patient presents with  . Abdominal Pain    HPI:  Madison Benjamin is a 49 y.o. female here for further evaluation of abdominal pain. Patient states her symptoms began Mother's Day weekend. She has been to the emergency department twice. Complains of right upper quadrant abdominal pain which radiates into her back and chest, associated with abdominal swelling, vomiting, diarrhea. Worse with meals. Reports 30 pound weight loss. States is the worst pain she is ever felt. Felt like being in labor and having a heart attack at the same time. Initial ER visit, she was started on pantoprazole which she felt like worsened her abdominal pain. She was also diagnosed with a UTI at the time and provided Levaquin. Self medicated with Pepto-Bismol, Imodium, Zantac. Nothing provided any relief. Notes that she was taking Advil and/or Aleve multiple times daily "forever".  Pelvic she had a watermelon in her right upper quadrant. Notes that baseline typically has several stools in the morning chronically however over the past 2 months 6-7 stools daily. Given tramadol for pain and notes now she only has a bowel movement 1-2 times daily. She is taking tramadol about twice daily. Takes mostly for headache that was given to her for replacement of NSAIDs.  Initial ER visit on 02/28/2015. LFTs, lipase unremarkable. White blood cell count 12,400. Acute abdominal series showed emphysematous changes of the lungs. Second ER visit on 03/18/2015, chest x-ray showed chronic interstitial coarsening of the lungs. White blood cell count 9400, LFTs and d-dimer normal. Subsequently underwent abdominal ultrasound and noted to have prominence of the common bile duct measuring 7.7 mm no evidence of gallstones.  Recently establish care Dr. Scharlene Gloss office. Saw Roe Rutherford, NP and was started on Nexium 40 mg daily and Carafate 1 g  before meals. Patient was advised to stop NSAIDs. States that she's feeling so much better now. Only having some burning in the right upper quadrant. Able to eat. Continues to have loose stools but she contributes it to being able to eat anything she wants. Notes her stools are normal if takes tramadol twice daily and is taking the tramadol more to control diarrhea then for pain. States she try to come off Carafate 2 weeks after starting it per recommendations but she had recurrent abdominal pain. No dysphagia. In the beginning stools are black but she may have been on Pepto-Bismol at the time. Denies bright red blood per rectum.      Current Outpatient Prescriptions  Medication Sig Dispense Refill  . diphenhydrAMINE (SOMINEX) 25 MG tablet Take 25 mg by mouth at bedtime as needed for sleep.    Marland Kitchen esomeprazole (NEXIUM) 40 MG capsule     . hydrochlorothiazide (HYDRODIURIL) 12.5 MG tablet Take 12.5 mg by mouth daily. for high blood pressure  3  . lisinopril (PRINIVIL,ZESTRIL) 10 MG tablet Take 10 mg by mouth daily.  0  . pseudoephedrine-acetaminophen (TYLENOL SINUS) 30-500 MG TABS Take 1 tablet by mouth every 4 (four) hours as needed.    . sucralfate (CARAFATE) 1 G tablet     . traMADol (ULTRAM) 50 MG tablet Take 1 tablet (50 mg total) by mouth every 6 (six) hours as needed. 20 tablet 0   No current facility-administered medications for this visit.    Allergies as of 05/05/2015  . (No Known Allergies)    Past Medical History  Diagnosis Date  . Migraines   .  HTN (hypertension)   . Emphysema of lung     on CXR    Past Surgical History  Procedure Laterality Date  . Cesarean section      Family History  Problem Relation Age of Onset  . Stroke Father   . Ulcers Father   . Colitis Father   . Colon cancer Neg Hx   . Liver disease Neg Hx     History   Social History  . Marital Status: Legally Separated    Spouse Name: N/A  . Number of Children: 5  . Years of Education: N/A    Occupational History  . unemployed    Social History Main Topics  . Smoking status: Current Every Day Smoker -- 1.00 packs/day for 30 years    Types: Cigarettes  . Smokeless tobacco: Not on file  . Alcohol Use: No  . Drug Use: No  . Sexual Activity: Yes   Other Topics Concern  . Not on file   Social History Narrative      ROS:  General: Negative for anorexia, weight loss, fever, chills, fatigue, weakness. Eyes: Negative for vision changes.  ENT: Negative for hoarseness, difficulty swallowing , nasal congestion. CV: Negative for chest pain, angina, palpitations, dyspnea on exertion, peripheral edema.  Respiratory: Negative for dyspnea at rest, dyspnea on exertion, cough, sputum, wheezing.  GI: See history of present illness. GU:  Negative for dysuria, hematuria, urinary incontinence, urinary frequency, nocturnal urination.  MS: Negative for joint pain, low back pain.  Derm: Negative for rash or itching.  Neuro: Negative for weakness, abnormal sensation, seizure, frequent headaches, memory loss, confusion.  Psych: Negative for anxiety, depression, suicidal ideation, hallucinations.  Endo: Negative for unusual weight change.  Heme: Negative for bruising or bleeding. Allergy: Negative for rash or hives.    Physical Examination:  BP 125/80 mmHg  Pulse 76  Temp(Src) 98.2 F (36.8 C) (Oral)  Ht  (1.702 m)  Wt 198 lb (89.812 kg)  BMI 31.00 kg/m2  LMP 05/26/2014   General: Well-nourished, well-developed in no acute distress.  Head: Normocephalic, atraumatic.   Eyes: Conjunctiva pink, no icterus. Mouth: Oropharyngeal mucosa moist and pink , no lesions erythema or exudate. Neck: Supple without thyromegaly, masses, or lymphadenopathy.  Lungs: Clear to auscultation bilaterally.  Heart: Regular rate and rhythm, no murmurs rubs or gallops.  Abdomen: Bowel sounds are normal, minimal ruq tenderness, nondistended, no hepatosplenomegaly or masses, no abdominal bruits or     hernia , no rebound or guarding.   Rectal: not performed Extremities: No lower extremity edema. No clubbing or deformities.  Neuro: Alert and oriented x 4 , grossly normal neurologically.  Skin: Warm and dry, no rash or jaundice.   Psych: Alert and cooperative, normal mood and affect.  Labs: Lab Results  Component Value Date   LIPASE 20* 02/28/2015   Lab Results  Component Value Date   ALT 15 03/18/2015   AST 10* 03/18/2015   ALKPHOS 70 03/18/2015   BILITOT 0.4 03/18/2015   Lab Results  Component Value Date   CREATININE 0.71 03/18/2015   BUN 11 03/18/2015   NA 137 03/18/2015   K 4.0 03/18/2015   CL 101 03/18/2015   CO2 24 03/18/2015   Lab Results  Component Value Date   WBC 9.4 03/18/2015   HGB 14.6 03/18/2015   HCT 42.5 03/18/2015   MCV 89.9 03/18/2015   PLT 285 03/18/2015   Labs from 03/23/2015, glucose 139, total bilirubin 0.3, alkaline phosphatase 62, AST 8,  ALT 21, albumen 4.2, white blood cell count 7600, hemoglobin 15.4, hematocrit 45.3, platelets 293,000, amylase 24, lipase 19, TSH 0.4-3, hemoglobin A1c 5.7  Imaging Studies: No results found.

## 2015-05-05 NOTE — Assessment & Plan Note (Signed)
Acute on chronic diarrhea, may have baseline IBS with worsening symptoms in the setting of recent acute illness. Diarrhea reportedly present prior to Levaquin. We'll check stool studies to rule out C. difficile and other infectious etiologies. Advised against using tramadol solely to treat her diarrhea.  Briefly discussed possibility of colonoscopy if needed to further evaluate diarrhea but patient is not interested at this time. We will continue to monitor for now.

## 2015-05-08 ENCOUNTER — Other Ambulatory Visit: Payer: Self-pay

## 2015-05-08 DIAGNOSIS — R112 Nausea with vomiting, unspecified: Secondary | ICD-10-CM

## 2015-05-08 DIAGNOSIS — R1011 Right upper quadrant pain: Secondary | ICD-10-CM

## 2015-05-08 DIAGNOSIS — K219 Gastro-esophageal reflux disease without esophagitis: Secondary | ICD-10-CM

## 2015-05-16 NOTE — Progress Notes (Signed)
CC'ED TO PCP 

## 2015-05-19 ENCOUNTER — Ambulatory Visit (HOSPITAL_COMMUNITY)
Admission: RE | Admit: 2015-05-19 | Discharge: 2015-05-19 | Disposition: A | Discharge status: Home / Self Care | Payer: PRIVATE HEALTH INSURANCE | Source: Ambulatory Visit | Attending: Gastroenterology | Admitting: Gastroenterology

## 2015-05-19 ENCOUNTER — Encounter (HOSPITAL_COMMUNITY)
Admission: RE | Disposition: A | Discharge status: Home / Self Care | Payer: Self-pay | Source: Ambulatory Visit | Attending: Gastroenterology

## 2015-05-19 ENCOUNTER — Encounter (HOSPITAL_COMMUNITY): Payer: Self-pay | Admitting: *Deleted

## 2015-05-19 DIAGNOSIS — R110 Nausea: Secondary | ICD-10-CM | POA: Insufficient documentation

## 2015-05-19 DIAGNOSIS — I10 Essential (primary) hypertension: Secondary | ICD-10-CM | POA: Insufficient documentation

## 2015-05-19 DIAGNOSIS — R1011 Right upper quadrant pain: Secondary | ICD-10-CM | POA: Diagnosis not present

## 2015-05-19 DIAGNOSIS — Z79899 Other long term (current) drug therapy: Secondary | ICD-10-CM | POA: Insufficient documentation

## 2015-05-19 DIAGNOSIS — R112 Nausea with vomiting, unspecified: Secondary | ICD-10-CM

## 2015-05-19 DIAGNOSIS — K2980 Duodenitis without bleeding: Secondary | ICD-10-CM | POA: Diagnosis not present

## 2015-05-19 DIAGNOSIS — K2970 Gastritis, unspecified, without bleeding: Secondary | ICD-10-CM | POA: Insufficient documentation

## 2015-05-19 DIAGNOSIS — F17210 Nicotine dependence, cigarettes, uncomplicated: Secondary | ICD-10-CM | POA: Insufficient documentation

## 2015-05-19 DIAGNOSIS — K219 Gastro-esophageal reflux disease without esophagitis: Secondary | ICD-10-CM | POA: Diagnosis not present

## 2015-05-19 DIAGNOSIS — R1013 Epigastric pain: Secondary | ICD-10-CM | POA: Diagnosis not present

## 2015-05-19 HISTORY — PX: ESOPHAGOGASTRODUODENOSCOPY: SHX5428

## 2015-05-19 SURGERY — EGD (ESOPHAGOGASTRODUODENOSCOPY)
Anesthesia: Moderate Sedation

## 2015-05-19 MED ORDER — ESOMEPRAZOLE MAGNESIUM 40 MG PO CPDR: DELAYED_RELEASE_CAPSULE | ORAL | Status: DC

## 2015-05-19 MED ORDER — MEPERIDINE HCL 100 MG/ML IJ SOLN
INTRAMUSCULAR | Status: DC | PRN
  Administered 2015-05-19 (×2): 25 mg via INTRAVENOUS
  Administered 2015-05-19: 50 mg via INTRAVENOUS

## 2015-05-19 MED ORDER — MIDAZOLAM HCL 5 MG/5ML IJ SOLN
INTRAMUSCULAR | Status: DC | PRN
  Administered 2015-05-19 (×3): 2 mg via INTRAVENOUS

## 2015-05-19 MED ORDER — SODIUM CHLORIDE 0.9 % IV SOLN
INTRAVENOUS | Status: DC
  Administered 2015-05-19: 1000 mL via INTRAVENOUS

## 2015-05-19 MED ORDER — LIDOCAINE VISCOUS 2 % MT SOLN
OROMUCOSAL | Status: AC
  Filled 2015-05-19: qty 15

## 2015-05-19 MED ORDER — LIDOCAINE VISCOUS 2 % MT SOLN
OROMUCOSAL | Status: DC | PRN
  Administered 2015-05-19: 3 mL via OROMUCOSAL

## 2015-05-19 MED ORDER — MIDAZOLAM HCL 5 MG/5ML IJ SOLN
INTRAMUSCULAR | Status: AC
  Filled 2015-05-19: qty 10

## 2015-05-19 MED ORDER — MEPERIDINE HCL 100 MG/ML IJ SOLN
INTRAMUSCULAR | Status: AC
  Filled 2015-05-19: qty 2

## 2015-05-19 MED ORDER — STERILE WATER FOR IRRIGATION IR SOLN
Status: DC | PRN
  Administered 2015-05-19: 12:00:00

## 2015-05-19 NOTE — H&P (Signed)
  Primary Care Physician:  Catalina Pizza, MD Primary Gastroenterologist:  Dr. Darrick Penna  Pre-Procedure History & Physical: HPI:  Madison Benjamin is a 49 y.o. female here for DYSPEPSIA.   Past Medical History  Diagnosis Date  . Migraines   . HTN (hypertension)   . Emphysema of lung     on CXR    Past Surgical History  Procedure Laterality Date  . Cesarean section      Prior to Admission medications   Medication Sig Start Date End Date Taking? Authorizing Provider  esomeprazole (NEXIUM) 40 MG capsule Take 1 capsule (40 mg total) by mouth daily before breakfast. 05/05/15  Yes Tiffany Kocher, PA-C  hydrochlorothiazide (HYDRODIURIL) 12.5 MG tablet Take 12.5 mg by mouth daily. for high blood pressure 04/15/15  Yes Historical Provider, MD  lisinopril (PRINIVIL,ZESTRIL) 10 MG tablet Take 10 mg by mouth daily. 04/26/15  Yes Historical Provider, MD  sucralfate (CARAFATE) 1 G tablet Take 1 tablet (1 g total) by mouth 4 (four) times daily -  with meals and at bedtime. 05/05/15  Yes Tiffany Kocher, PA-C  traMADol (ULTRAM) 50 MG tablet Take 1 tablet (50 mg total) by mouth every 6 (six) hours as needed. 02/28/15  Yes Bethann Berkshire, MD    Allergies as of 05/08/2015  . (No Known Allergies)    Family History  Problem Relation Age of Onset  . Stroke Father   . Ulcers Father   . Colitis Father   . Colon cancer Neg Hx   . Liver disease Neg Hx   . Hypertension Mother   . Heart failure Mother     History   Social History  . Marital Status: Married    Spouse Name: N/A  . Number of Children: 5  . Years of Education: N/A   Occupational History  . unemployed    Social History Main Topics  . Smoking status: Current Every Day Smoker -- 1.00 packs/day for 36 years    Types: Cigarettes  . Smokeless tobacco: Not on file  . Alcohol Use: No  . Drug Use: No  . Sexual Activity: Yes   Other Topics Concern  . Not on file   Social History Narrative    Review of Systems: See HPI, otherwise negative  ROS   Physical Exam: BP 114/80 mmHg  Pulse 62  Temp(Src) 97.8 F (36.6 C) (Oral)  Resp 16  Ht  (1.702 m)  Wt 185 lb (83.915 kg)  BMI 28.97 kg/m2  SpO2 99%  LMP 05/26/2014 General:   Alert,  pleasant and cooperative in NAD Head:  Normocephalic and atraumatic. Neck:  Supple; Lungs:  Clear throughout to auscultation.    Heart:  Regular rate and rhythm. Abdomen:  Soft, nontender and nondistended. Normal bowel sounds, without guarding, and without rebound.   Neurologic:  Alert and  oriented x4;  grossly normal neurologically.  Impression/Plan:     DYSPEPSIA  PLAN:  EGD TODAY

## 2015-05-19 NOTE — Op Note (Signed)
[facility] [address] [place], [address]   ENDOSCOPY PROCEDURE REPORT  PATIENT: Madison, Benjamin  MR#: [id number] BIRTHDATE: 06-05-1966 , 49  yrs. old GENDER: female  ENDOSCOPIST: West Bali, MD REFERRED NW:GNFA Hall, M.D.  PROCEDURE DATE: 2015-05-25 PROCEDURE:   EGD w/ biopsy  INDICATIONS:nausea.   abdominal pain in the upper right quadrant. USES NSAIDs. SMOKES. EATS PIZZA. MEDICATIONS: Demerol 100 mg IV and Versed 6 mg IV TOPICAL ANESTHETIC:   Viscous Xylocaine ASA CLASS:  DESCRIPTION OF PROCEDURE:     Physical exam was performed.  Informed consent was obtained from the patient after explaining the benefits, risks, and alternatives to the procedure.  The patient was connected to the monitor and placed in the left lateral position.  Continuous oxygen was provided by nasal cannula and IV medicine administered through an indwelling cannula.  After administration of sedation, the patients esophagus was intubated and the [id number] ([id number])  endoscope was advanced under direct visualization to the second portion of the duodenum.  The scope was removed slowly by carefully examining the color, texture, anatomy, and integrity of the mucosa on the way out.  The patient was recovered in endoscopy and discharged home in satisfactory condition.  Estimated blood loss is zero unless otherwise noted in this procedure report.    ESOPHAGUS: The mucosa of the esophagus appeared normal.   STOMACH: Three non-bleeding, clean-based and shallow ulcers were found in the gastric antrum and gastric body.  Biopsies were taken around the ulcers.   MODERTAE SIZE HIATAL HERNIA. Moderate erosive gastritis (inflammation) was found in the gastric antrum.  Multiple biopsies were performed using cold forceps.   DUODENUM: Moderate duodenal inflammation was found in the duodenal bulb.   The duodenal mucosa showed no abnormalities in the 2nd part of the duodenum. COMPLICATIONS:  There were no immediate complications.  ENDOSCOPIC IMPRESSION: 1.   RUQ PAIN/NAUSEA DUE TO GERD/PUD/GASTRITIS/DUODENITIS 2. MODERATE HIATAL HERNIA  RECOMMENDATIONS: STRICTLY AVOID ASPIRIN, BC/GOODY POWDERS, IBUPROFEN/MOTRIN, OR NAPROXEN/ALEVE. FOLLOW A LOW FAT DIET. AVOID FOOD THAT TRIGGERS REFLUX AS WELL. NEXIUM.  TAKE 30 MINUTES BEFORE MEALS TWICE DAILY. USE ULTRAM or CARAFATE AS NEEDED FOR PAIN. AWAIT BIOPSY RESULTS. FOLLOW UP IN 4 MOS.  REPEAT EXAM:    _______________________________ eSignedWest Bali, MD 05/25/2015 1:24 PM  CPT CODES: ICD CODES:  The ICD and CPT codes recommended by this software are interpretations from the data that the clinical staff has captured with the software.  The verification of the translation of this report to the ICD and CPT codes and modifiers is the sole responsibility of the health care institution and practicing physician where this report was generated.  [employer] will not be held responsible for the validity of the ICD and CPT codes included on this report.  AMA assumes no liability for data contained or not contained herein. CPT is a Publishing rights manager of the [employer].  PATIENT NAME:  Madison, Benjamin MR#: [id number]

## 2015-05-19 NOTE — Discharge Instructions (Addendum)
YOUR UPPER ABDOMINAL PAIN IS DUE TO ULCERS AND EROSIVE GASTRITIS/DUODENITIS DUE TO USING IBUPROFEN. I BIOPSIED YOUR STOMACH AND SMALL BOWEL.   STRICTLY AVOID ASPIRIN, BC/GOODY POWDERS, IBUPROFEN/MOTRIN, OR NAPROXEN/ALEVE BECAUSE YOU HAVE A ULCERS IN YOUR STOMACH.  FOLLOW A LOW FAT DIET. SEE INFO BELOW.  AVOID FOOD THAT TRIGGERS REFLUX AS WELL. SEE INFO BELOW.   CONTINUE NEXIUM. TAKE 30 MINUTES BEFORE MEALS TWICE DAILY.  USE ULTRAM AS NEEDED FOR PAIN.  YOUR BIOPSY RESULTS WILL BE AVAILABLE IN MY CHART AFTER  AUG 9 AND MY OFFICE WILL CONTACT YOU IN 10-14 DAYS WITH YOUR RESULTS.    FOLLOW UP IN 4 MOS.     UPPER ENDOSCOPY AFTER CARE Read the instructions outlined below and refer to this sheet in the next week. These discharge instructions provide you with general information on caring for yourself after you leave the hospital. While your treatment has been planned according to the most current medical practices available, unavoidable complications occasionally occur. If you have any problems or questions after discharge, call DR. Junelle Hashemi, [phone].  ACTIVITY  You may resume your regular activity, but move at a slower pace for the next 24 hours.   Take frequent rest periods for the next 24 hours.   Walking will help get rid of the air and reduce the bloated feeling in your belly (abdomen).   No driving for 24 hours (because of the medicine (anesthesia) used during the test).   You may shower.   Do not sign any important legal documents or operate any machinery for 24 hours (because of the anesthesia used during the test).    NUTRITION  Drink plenty of fluids.   You may resume your normal diet as instructed by your doctor.   Begin with a light meal and progress to your normal diet. Heavy or fried foods are harder to digest and may make you feel sick to your stomach (nauseated).   Avoid alcoholic beverages for 24 hours or as instructed.    MEDICATIONS  You may resume  your normal medications.   WHAT YOU CAN EXPECT TODAY  Some feelings of bloating in the abdomen.   Passage of more gas than usual.    IF YOU HAD A BIOPSY TAKEN DURING THE UPPER ENDOSCOPY:    Eat a soft diet IF YOU HAVE NAUSEA, BLOATING, ABDOMINAL PAIN, OR VOMITING.    FINDING OUT THE RESULTS OF YOUR TEST Not all test results are available during your visit. DR. Darrick Penna WILL CALL YOU WITHIN 7 DAYS OF YOUR PROCEDUE WITH YOUR RESULTS. Do not assume everything is normal if you have not heard from DR. Marine Lezotte IN ONE WEEK, CALL HER OFFICE AT [phone].  SEEK IMMEDIATE MEDICAL ATTENTION AND CALL THE OFFICE: [phone] IF:  You have more than a spotting of blood in your stool.   Your belly is swollen (abdominal distention).   You are nauseated or vomiting.   You have a temperature over 101F.   You have abdominal pain or discomfort that is severe or gets worse throughout the day.   Ulcer Disease (Peptic Ulcer, Gastric Ulcer, Duodenal Ulcer) You have an ulcer. This may be in your stomach (gastric ulcer) or in the first part of your small bowel, the duodenum (duodenal ulcer). An ulcer is a break in the lining of the stomach or duodenum. The ulcer causes erosion into the deeper tissue.  CAUSES The stomach has a lining to protect itself from the acid that digests food. The lining can be damaged  in two main ways:  The Helico Pylori bacteria (H. Pyolori) can infect the lining of the stomach and cause ulcers.   Nonsteroidal, anti-inflammatory medications (NSAIDS) can cause gastric ulcerations.   Smoking tobacco can increase the acid in the stomach. This can lead to ulcers, and will impair healing of ulcers.   Other factors, such as alcohol use and stress may contribute to ulcer formation.  SYMPTOMS The problems (symptoms) of ulcer disease are usually a burning or gnawing of the mid-upper belly (abdomen). This is often worse on an empty stomach and may get better with food. This may  be associated with feeling sick to your stomach (nausea), bloating, and vomiting.  HOME CARE INSTRUCTIONS Continue regular work and usual activities unless advised otherwise by your caregiver.  Avoid tobacco, alcohol, and caffeine. Tobacco use will decrease and slow the rates of healing.   Avoid foods that seem to aggravate or cause discomfort.     Lifestyle and home remedies TO HELP CONTROL HEARTBURN/REFLUX/UPPER ABDOMINALPAIN.     Control your weight. Being overweight is a major risk factor for heartburn and GERD. Excess pounds put pressure on your abdomen, pushing up your stomach and causing acid to back up into your esophagus.    Eat smaller meals. 4 TO 6 MEALS A DAY. This reduces pressure on the lower esophageal sphincter, helping to prevent the valve from opening and acid from washing back into your esophagus.    Loosen your belt. Clothes that fit tightly around your waist put pressure on your abdomen and the lower esophageal sphincter.     Eliminate heartburn triggers. Everyone has specific triggers.     Common triggers such as fatty or fried foods, spicy food, tomato sauce, carbonated beverages, alcohol, chocolate, mint, garlic, onion, caffeine and nicotine may make heartburn worse.    Avoid stooping or bending. Tying your shoes is OK. Bending over for longer periods to weed your garden isn't, especially soon after eating.    Don't lie down after a meal. Wait at least three to four hours after eating before going to bed, and don't lie down right after eating.    PLACE THE HEAD OF YOUR BED ON 6 INCH BLOCKS.  Alternative medicine  Several home remedies exist for treating GERD, but they provide only temporary relief. They include drinking baking soda (sodium bicarbonate) added to water or drinking other fluids such as baking soda mixed with cream of tartar and water.  Although these liquids create temporary relief by neutralizing, washing away or buffering acids,  eventually they aggravate the situation by adding gas and fluid to your stomach, increasing pressure and causing more acid reflux. Further, adding more sodium to your diet may increase your blood pressure and add stress to your heart, and excessive bicarbonate ingestion can alter the acid-base balance in your body.  Esophagogastroduodenoscopy Care After Refer to this sheet in the next few weeks. These instructions provide you with information on caring for yourself after your procedure. Your caregiver may also give you more specific instructions. Your treatment has been planned according to current medical practices, but problems sometimes occur. Call your caregiver if you have any problems or questions after your procedure.  HOME CARE INSTRUCTIONS  Do not eat or drink anything until the numbing medicine (local anesthetic) has worn off and your gag reflex has returned. You will know that the local anesthetic has worn off when you can swallow comfortably.  Do not drive for 12 hours after the procedure or as directed  by your caregiver.  Only take medicines as directed by your caregiver. SEEK MEDICAL CARE IF:   You cannot stop coughing.  You are not urinating at all or less than usual. SEEK IMMEDIATE MEDICAL CARE IF:  You have difficulty swallowing.  You cannot eat or drink.  You have worsening throat or chest pain.  You have dizziness, lightheadedness, or you faint.  You have nausea or vomiting.  You have chills.  You have a fever.  You have severe abdominal pain.  You have black, tarry, or bloody stools. Document Released: 09/16/2012 Document Reviewed: 09/16/2012 [facility] Patient Information 2015 [place], [place]. This information is not intended to replace advice given to you by your health care provider. Make sure you discuss any questions you have with your health care provider.

## 2015-05-23 ENCOUNTER — Encounter (HOSPITAL_COMMUNITY): Payer: Self-pay | Admitting: Gastroenterology

## 2015-05-25 ENCOUNTER — Telehealth: Payer: Self-pay | Admitting: Gastroenterology

## 2015-05-25 MED ORDER — PREDNISONE 10 MG PO TABS: ORAL_TABLET | ORAL | Status: DC

## 2015-05-25 NOTE — Telephone Encounter (Signed)
Pt is aware of results. 

## 2015-05-25 NOTE — Telephone Encounter (Signed)
PLEASE CALL PT. 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 IT IS SIMILAR TO AN ALLERGIC REACTION. HE NEEDS PREDNISONE 20 MG FOR 7 DAYS THE 10 MG QD FOR 7 DAYS THEN 5 MG QD FOR 7 DAYS. TAKE STEROIDS WITH FOOD OR MILK.  STEROIDS MAY CAUSE FLUID RETENTION, AGITATION, INSOMNIA, AND UPPER ABDOMINAL PAIN.   STRICTLY AVOID ASPIRIN, BC/GOODY POWDERS, IBUPROFEN/MOTRIN, OR NAPROXEN/ALEVE BECAUSE YOU HAVE A ULCERS IN YOUR STOMACH.  FOLLOW A LOW FAT DIET. Marland Kitchen  AVOID FOOD THAT TRIGGERS REFLUX AS WELL.   CONTINUE NEXIUM. TAKE 30 MINUTES BEFORE MEALS TWICE DAILY.  USE ULTRAM AS NEEDED FOR PAIN.   FOLLOW UP IN 4 MOS E30 PUD, EOSINOPHILIC GASTRITIS/DUODENITIS.

## 2015-05-26 ENCOUNTER — Encounter: Payer: Self-pay | Admitting: Gastroenterology

## 2015-05-26 NOTE — Telephone Encounter (Signed)
APPOINTMENT MADE AND LETTER SENT °

## 2015-09-25 ENCOUNTER — Ambulatory Visit: Payer: PRIVATE HEALTH INSURANCE | Admitting: Gastroenterology

## 2015-10-26 ENCOUNTER — Encounter: Payer: Self-pay | Admitting: Gastroenterology

## 2015-10-26 ENCOUNTER — Ambulatory Visit (INDEPENDENT_AMBULATORY_CARE_PROVIDER_SITE_OTHER): Payer: PRIVATE HEALTH INSURANCE | Admitting: Gastroenterology

## 2015-10-26 VITALS — BP 120/84 | HR 80 | Temp 98.8°F | Ht 67.0 in | Wt 186.2 lb

## 2015-10-26 DIAGNOSIS — K5900 Constipation, unspecified: Secondary | ICD-10-CM | POA: Diagnosis not present

## 2015-10-26 DIAGNOSIS — K219 Gastro-esophageal reflux disease without esophagitis: Secondary | ICD-10-CM | POA: Diagnosis not present

## 2015-10-26 DIAGNOSIS — R1011 Right upper quadrant pain: Secondary | ICD-10-CM

## 2015-10-26 DIAGNOSIS — K838 Other specified diseases of biliary tract: Secondary | ICD-10-CM

## 2015-10-26 DIAGNOSIS — K279 Peptic ulcer, site unspecified, unspecified as acute or chronic, without hemorrhage or perforation: Secondary | ICD-10-CM

## 2015-10-26 DIAGNOSIS — K5281 Eosinophilic gastritis or gastroenteritis: Secondary | ICD-10-CM

## 2015-10-26 MED ORDER — LINACLOTIDE 145 MCG PO CAPS: 145.0000 ug | ORAL_CAPSULE | Freq: Every day | ORAL | Status: AC

## 2015-10-26 MED ORDER — PANTOPRAZOLE SODIUM 40 MG PO TBEC: 40.0000 mg | DELAYED_RELEASE_TABLET | Freq: Two times a day (BID) | ORAL | Status: AC

## 2015-10-26 NOTE — Assessment & Plan Note (Signed)
Trial of Linzess 145 mcg daily.  

## 2015-10-26 NOTE — Assessment & Plan Note (Signed)
Ruq pain, GERD symptoms persistent but improved. PUD, eosinophilic gastritis/duodenitis s/p prednisone. She is using Nexium 3 daily. Will stop. Try pantoprazole BID. Discussed antireflux measures. Need to eliminate NSAID use. Discuss joint pain with PCP. Call if ongoing symptoms. Return to office in 3 months with Dr. Darrick PennaFields.

## 2015-10-26 NOTE — Assessment & Plan Note (Signed)
May require further imaging. Further recommendations to follow.

## 2015-10-26 NOTE — Progress Notes (Signed)
      Primary Care Physician: Dwana MelenaZack Hall, MD  Primary Gastroenterologist:  Jonette EvaSandi Fields, MD   Chief Complaint  Patient presents with  . Follow-up    Doing much better    HPI: Madison Benjamin is a 50 y.o. female here follow-up. Initially presented back in July with complaints of right upper quadrant abdominal pain associated with vomiting and diarrhea with workup revealing prominent common bile duct is 7.7 mm.  Associated 30 pound weight loss. EGD back in August showed 3 nonbleeding, clean based and shallow ulcers in the gastric antrum and gastric body. Moderate sized hiatal hernia. Moderate erosive gastritis, moderate duodenal inflammation. Biopsies from the duodenum and stomach showed increased eosinophils. Patient had been on NSAIDs as well. Asked to stop. Treated with prednisone taper.  Patient states she is much improved. Didn't think the prednisone helped her. She is avoiding a lot of foods. Can tolerate Baked fish and lettuce. Stays constipation all the time. Avoiding all dairy except cheese. Nexium  Once in am and two at bedtime. When lays down, lot of coughing unless she takes nexium 80mg  daily. Epigastric burning and ruq pain a lot better but still present. Feels like a rock or fist stuck. She raised head of be up 6 inches. Taking ibuprofen 400mg  about 3 times per week for joint pain. Doesn't want to be on narcotics.   Current Outpatient Prescriptions  Medication Sig Dispense Refill  . acetaminophen (TYLENOL) 650 MG CR tablet Take 650 mg by mouth every 8 (eight) hours as needed for pain.    . ALBUTEROL IN Inhale into the lungs.    Marland Kitchen. buPROPion (WELLBUTRIN XL) 300 MG 24 hr tablet Take 300 mg by mouth daily.    . diphenhydramine-acetaminophen (TYLENOL PM) 25-500 MG TABS tablet Take 2 tablets by mouth at bedtime.    Marland Kitchen. esomeprazole (NEXIUM) 40 MG capsule 1 po 30 miNS PRIOR TO MEALS BID 60 capsule 11  . lisinopril-hydrochlorothiazide (PRINZIDE,ZESTORETIC) 10-12.5 MG tablet Take 1 tablet  by mouth daily.    . NON FORMULARY Cold and Head Congestion     No current facility-administered medications for this visit.    Allergies as of 10/26/2015  . (No Known Allergies)    ROS:  General: Negative for anorexia, weight loss, fever, chills, fatigue, weakness. ENT: Negative for hoarseness, difficulty swallowing , nasal congestion. CV: Negative for chest pain, angina, palpitations, dyspnea on exertion, peripheral edema.  Respiratory: Negative for dyspnea at rest, dyspnea on exertion, cough, sputum, wheezing.  GI: See history of present illness. GU:  Negative for dysuria, hematuria, urinary incontinence, urinary frequency, nocturnal urination.  Endo: Negative for unusual weight change.    Physical Examination:   BP 120/84 mmHg  Pulse 80  Temp(Src) 98.8 F (37.1 C) (Oral)  Ht 5\' 7"  (1.702 m)  Wt 186 lb 3.2 oz (84.46 kg)  BMI 29.16 kg/m2  LMP 05/26/2014  General: Well-nourished, well-developed in no acute distress.  Eyes: No icterus. Mouth: Oropharyngeal mucosa moist and pink , no lesions erythema or exudate. Lungs: Clear to auscultation bilaterally.  Heart: Regular rate and rhythm, no murmurs rubs or gallops.  Abdomen: Bowel sounds are normal, nontender, nondistended, no hepatosplenomegaly or masses, no abdominal bruits or hernia , no rebound or guarding.   Extremities: No lower extremity edema. No clubbing or deformities. Neuro: Alert and oriented x 4   Skin: Warm and dry, no jaundice.   Psych: Alert and cooperative, normal mood and affect.

## 2015-10-26 NOTE — Patient Instructions (Signed)
1. Stop nexium.  2. Start pantoprazole once before breakfast and once before bedtime. 3. Start Linzess once daily on empty stomach to help with constipation. Samples and rebate card provided. RX sent to your pharmacy. 4. I will discuss further with Dr. Darrick PennaFields regarding if you need further imaging of the common bile duct that is dilated.  5. Recommend colonoscopy by age 50.  6. Discuss joint pain with your PCP. 7. Return to the office in 3 months with Dr. Darrick PennaFields.

## 2015-10-27 NOTE — Progress Notes (Signed)
cc'ed to pcp °

## 2015-11-18 ENCOUNTER — Emergency Department (HOSPITAL_COMMUNITY): Payer: No Typology Code available for payment source

## 2015-11-18 ENCOUNTER — Observation Stay (HOSPITAL_COMMUNITY)
Admission: EM | Admit: 2015-11-18 | Discharge: 2015-11-18 | Discharge status: Against Medical Advice | Payer: No Typology Code available for payment source | Attending: Internal Medicine | Admitting: Internal Medicine

## 2015-11-18 ENCOUNTER — Encounter (HOSPITAL_COMMUNITY): Payer: Self-pay | Admitting: Emergency Medicine

## 2015-11-18 DIAGNOSIS — R454 Irritability and anger: Secondary | ICD-10-CM

## 2015-11-18 DIAGNOSIS — R4781 Slurred speech: Principal | ICD-10-CM | POA: Insufficient documentation

## 2015-11-18 DIAGNOSIS — K589 Irritable bowel syndrome without diarrhea: Secondary | ICD-10-CM | POA: Diagnosis not present

## 2015-11-18 DIAGNOSIS — Z8711 Personal history of peptic ulcer disease: Secondary | ICD-10-CM | POA: Insufficient documentation

## 2015-11-18 DIAGNOSIS — I10 Essential (primary) hypertension: Secondary | ICD-10-CM | POA: Diagnosis not present

## 2015-11-18 DIAGNOSIS — Z823 Family history of stroke: Secondary | ICD-10-CM | POA: Insufficient documentation

## 2015-11-18 DIAGNOSIS — J449 Chronic obstructive pulmonary disease, unspecified: Secondary | ICD-10-CM | POA: Diagnosis not present

## 2015-11-18 DIAGNOSIS — Z8379 Family history of other diseases of the digestive system: Secondary | ICD-10-CM | POA: Insufficient documentation

## 2015-11-18 DIAGNOSIS — Z79899 Other long term (current) drug therapy: Secondary | ICD-10-CM | POA: Insufficient documentation

## 2015-11-18 DIAGNOSIS — K219 Gastro-esophageal reflux disease without esophagitis: Secondary | ICD-10-CM | POA: Diagnosis present

## 2015-11-18 DIAGNOSIS — R531 Weakness: Secondary | ICD-10-CM | POA: Diagnosis not present

## 2015-11-18 DIAGNOSIS — R202 Paresthesia of skin: Secondary | ICD-10-CM

## 2015-11-18 DIAGNOSIS — R200 Anesthesia of skin: Secondary | ICD-10-CM | POA: Diagnosis not present

## 2015-11-18 DIAGNOSIS — F17210 Nicotine dependence, cigarettes, uncomplicated: Secondary | ICD-10-CM | POA: Diagnosis not present

## 2015-11-18 DIAGNOSIS — K5900 Constipation, unspecified: Secondary | ICD-10-CM | POA: Diagnosis present

## 2015-11-18 DIAGNOSIS — G43909 Migraine, unspecified, not intractable, without status migrainosus: Secondary | ICD-10-CM | POA: Insufficient documentation

## 2015-11-18 HISTORY — DX: Gastric ulcer, unspecified as acute or chronic, without hemorrhage or perforation: K25.9

## 2015-11-18 LAB — DIFFERENTIAL
BASOS ABS: 0 10*3/uL (ref 0.0–0.1)
BASOS PCT: 0 %
EOS ABS: 0.1 10*3/uL (ref 0.0–0.7)
EOS PCT: 1 %
LYMPHS PCT: 27 %
Lymphs Abs: 2.2 10*3/uL (ref 0.7–4.0)
MONO ABS: 0.6 10*3/uL (ref 0.1–1.0)
Monocytes Relative: 7 %
NEUTROS PCT: 65 %
Neutro Abs: 5.3 10*3/uL (ref 1.7–7.7)

## 2015-11-18 LAB — CBC
HCT: 48.1 % — ABNORMAL HIGH (ref 36.0–46.0)
HEMOGLOBIN: 16.5 g/dL — AB (ref 12.0–15.0)
MCH: 30.4 pg (ref 26.0–34.0)
MCHC: 34.3 g/dL (ref 30.0–36.0)
MCV: 88.7 fL (ref 78.0–100.0)
PLATELETS: 169 10*3/uL (ref 150–400)
RBC: 5.42 MIL/uL — AB (ref 3.87–5.11)
RDW: 13.3 % (ref 11.5–15.5)
WBC: 8.2 10*3/uL (ref 4.0–10.5)

## 2015-11-18 LAB — RAPID URINE DRUG SCREEN, HOSP PERFORMED
Amphetamines: NOT DETECTED
BARBITURATES: NOT DETECTED
BENZODIAZEPINES: NOT DETECTED
COCAINE: NOT DETECTED
Opiates: NOT DETECTED
Tetrahydrocannabinol: NOT DETECTED

## 2015-11-18 LAB — COMPREHENSIVE METABOLIC PANEL
ALT: 22 U/L (ref 14–54)
ANION GAP: 10 (ref 5–15)
AST: 13 U/L — ABNORMAL LOW (ref 15–41)
Albumin: 4.2 g/dL (ref 3.5–5.0)
Alkaline Phosphatase: 64 U/L (ref 38–126)
BUN: 14 mg/dL (ref 6–20)
CHLORIDE: 99 mmol/L — AB (ref 101–111)
CO2: 26 mmol/L (ref 22–32)
Calcium: 9.5 mg/dL (ref 8.9–10.3)
Creatinine, Ser: 0.87 mg/dL (ref 0.44–1.00)
Glucose, Bld: 113 mg/dL — ABNORMAL HIGH (ref 65–99)
POTASSIUM: 4.2 mmol/L (ref 3.5–5.1)
SODIUM: 135 mmol/L (ref 135–145)
Total Bilirubin: 0.4 mg/dL (ref 0.3–1.2)
Total Protein: 7.6 g/dL (ref 6.5–8.1)

## 2015-11-18 LAB — I-STAT CHEM 8, ED
BUN: 14 mg/dL (ref 6–20)
Calcium, Ion: 1.16 mmol/L (ref 1.12–1.23)
Chloride: 98 mmol/L — ABNORMAL LOW (ref 101–111)
Creatinine, Ser: 0.8 mg/dL (ref 0.44–1.00)
Glucose, Bld: 107 mg/dL — ABNORMAL HIGH (ref 65–99)
HEMATOCRIT: 52 % — AB (ref 36.0–46.0)
HEMOGLOBIN: 17.7 g/dL — AB (ref 12.0–15.0)
POTASSIUM: 4 mmol/L (ref 3.5–5.1)
SODIUM: 136 mmol/L (ref 135–145)
TCO2: 25 mmol/L (ref 0–100)

## 2015-11-18 LAB — PROTIME-INR
INR: 0.89 (ref 0.00–1.49)
PROTHROMBIN TIME: 12.3 s (ref 11.6–15.2)

## 2015-11-18 LAB — CBG MONITORING, ED: GLUCOSE-CAPILLARY: 109 mg/dL — AB (ref 65–99)

## 2015-11-18 LAB — ETHANOL: ALCOHOL ETHYL (B): 5 mg/dL — AB (ref <5)

## 2015-11-18 LAB — I-STAT TROPONIN, ED: TROPONIN I, POC: 0 ng/mL (ref 0.00–0.08)

## 2015-11-18 LAB — APTT: APTT: 29 s (ref 24–37)

## 2015-11-18 MED ORDER — SODIUM CHLORIDE 0.9 % IV SOLN
1000.0000 mL | Freq: Once | INTRAVENOUS | Status: AC
  Administered 2015-11-18: 1000 mL via INTRAVENOUS

## 2015-11-18 MED ORDER — ENOXAPARIN SODIUM 40 MG/0.4ML ~~LOC~~ SOLN: 40.0000 mg | SUBCUTANEOUS | Status: DC

## 2015-11-18 NOTE — ED Notes (Addendum)
Pt assisted up to bedside commode to provide urine sample. Pt provided yellow fall risk socks and fall risk arm band.

## 2015-11-18 NOTE — Progress Notes (Signed)
CODE STROKE - phone call @ 11:13 Pt in room and exam began @ 11:22 Exam finished and patient to ED @ 11:25 [facility] Radiology @ 11:29

## 2015-11-18 NOTE — H&P (Signed)
History and Physical  TRISA CRANOR ZOX:[id number] DOB: August 11, 1966 DOA: 11/18/2015  Referring physician: ED physician PCP: Dwana Melena, MD   Chief Complaint: slurred speech, weakness  HPI:  50 yo female with questionable PMH for irritable bowel syndrome constipation type.  Patient reports that she has previously had EGD and was told she had ulcers in her stomach and something wrong with her gallbladder.  On January 12th she was started on Linzess and patient stated that after beginning that medication she began experiencing sporadic numbness, weakness and difficulty speaking.  The episodes began on her left arm, leg and face, lasting only about a minute and during that time she felt complete numbness on the side of her body.  She was unable to use her phone or speak during the episode but states she was fully cognitive of the event.  The episodes progressed in length of time but have been similar.  She has had about 2-3 other episodes since the first.  Today she experienced right sided weakness, numbness and difficulty forming words.  Initially she was cognitive of the episode but her husband reports that she was speaking less clear than normal but was not creating meaningful sentences.  He did not think she was aware of what was going on.  The episode today lasted the longest- beginning around 9am and lasting until patient presented in the ED.  While in the ED patient was still speaking a "word salad" where she was speaking words that created no meaningful speech.  She was aware of the episode once in the ED.  She denies any residual weakness at this time.  She does mention she had an episode yesterday where her left hand was in flexed and contracted position and she could not straighten it.  She is unaware of this happening today.  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 Has not taken any over the counter supplements, has not gone  camping, flu shot in October 2016 and has had a recent history of congestion.   In the emergency department laboratory data was collected and showed hemoconcentration but otherwise was WNL Pertinent labs: below EKG: Independently reviewed. NSR Imaging: CT of head negative  Review of Systems:  As noted in HPI Negative for fever, visual changes, sore throat, rash, new muscle aches, chest pain, SOB, dysuria, bleeding, n/v/abdominal pain.  Past Medical History  Diagnosis Date  . Migraines   . HTN (hypertension)   . Emphysema of lung (HCC)     on CXR  . Gastric ulcer     Past Surgical History  Procedure Laterality Date  . Cesarean section    . Esophagogastroduodenoscopy N/A 05/19/2015    SLF: 1. RUQ pain/nausea due to GERD/ PUD/ gastritits/ duodentiits 2. Monerate hiatal hernia.    Social History:  reports that she has been smoking Cigarettes.  She has a 36 pack-year smoking history. She does not have any smokeless tobacco history on file. She reports that she does not drink alcohol or use illicit drugs. lives with their family Self-care  No Known Allergies  Family History  Problem Relation Age of Onset  . Stroke Father   . Ulcers Father   . Colitis Father   . Colon cancer Neg Hx   . Liver disease Neg Hx   . Hypertension Mother   . Heart failure Mother      Prior to Admission medications   Medication Sig Start Date  End Date Taking? Authorizing Provider  acetaminophen (TYLENOL) 325 MG tablet Take 325 mg by mouth every 6 (six) hours as needed.   Yes Historical Provider, MD  ALBUTEROL IN Inhale into the lungs.   Yes Historical Provider, MD  buPROPion (WELLBUTRIN XL) 300 MG 24 hr tablet Take 300 mg by mouth daily.   Yes Historical Provider, MD  Linaclotide Karlene Einstein) 145 MCG CAPS capsule Take 1 capsule (145 mcg total) by mouth daily. 10/26/15  Yes Tiffany Kocher, PA-C  lisinopril-hydrochlorothiazide (PRINZIDE,ZESTORETIC) 10-12.5 MG tablet Take 1 tablet by mouth daily.   Yes  Historical Provider, MD  pantoprazole (PROTONIX) 40 MG tablet Take 1 tablet (40 mg total) by mouth 2 (two) times daily. One before breakfast and one at bedtime. 10/26/15  Yes Tiffany Kocher, PA-C  acetaminophen (TYLENOL) 650 MG CR tablet Take 650 mg by mouth every 8 (eight) hours as needed for pain.    Historical Provider, MD  diphenhydramine-acetaminophen (TYLENOL PM) 25-500 MG TABS tablet Take 2 tablets by mouth at bedtime. Reported on 11/18/2015    Historical Provider, MD  NON FORMULARY Cold and Head Congestion    Historical Provider, MD   Physical Exam: Filed Vitals:   11/18/15 1132 11/18/15 1200 11/18/15 1230 11/18/15 1330  BP: 130/87 134/90 113/87 105/64  Pulse: 86 80 72 69  Temp:      TempSrc:      Resp: Height:      Weight:      SpO2: 97% 96% 92% 93%     General:  Appears calm and comfortable Eyes: PERRL, normal lids, irises & conjunctiva ENT: grossly normal hearing, lips & tongue Neck: no LAD, masses or thyromegaly Cardiovascular: RRR, no m/r/g. No LE edema. Telemetry: SR, no arrhythmias  Respiratory: CTA bilaterally, no w/r/r. Normal respiratory effort. Abdomen: soft, ntnd Skin: no rash or induration seen on limited exam Musculoskeletal: grossly normal tone BUE/BLE Psychiatric: grossly normal mood and affect, speech fluent and appropriate, short tempered during Neurologic exam Neurologic: grossly non-focal. Normal and equal tone and power in upper and lower extremities bilaterally, no dysdiadochokinesia, normal heel to shin and finger to nose, unable to assess abstract thinking due to noncompliance, serial 7s correct, short term memory 1/3 (able to obtain 3/3 after giving clues), no protonator drift, CN II-XII intact  Wt Readings from Last 3 Encounters:  11/18/15 86.183 kg (190 lb)  10/26/15 84.46 kg (186 lb 3.2 oz)  05/19/15 83.915 kg (185 lb)    Labs on Admission:  Basic Metabolic Panel:  Recent Labs Lab 11/18/15 1138 11/18/15 1144  NA 135 136  K  4.2 4.0  CL 99* 98*  CO2 26  --   GLUCOSE 113* 107*  BUN 14 14  CREATININE 0.87 0.80  CALCIUM 9.5  --     Liver Function Tests:  Recent Labs Lab 11/18/15 1138  AST 13*  ALT 22  ALKPHOS 64  BILITOT 0.4  PROT 7.6  ALBUMIN 4.2   No results for input(s): LIPASE, AMYLASE in the last 168 hours. No results for input(s): AMMONIA in the last 168 hours.  CBC:  Recent Labs Lab 11/18/15 1138 11/18/15 1144  WBC 8.2  --   NEUTROABS 5.3  --   HGB 16.5* 17.7*  HCT 48.1* 52.0*  MCV 88.7  --   PLT 169  --     Cardiac Enzymes: No results for input(s): CKTOTAL, CKMB, CKMBINDEX, TROPONINI in the last 168 hours.  Troponin (Point of Care Test)  Recent Labs  11/18/15 1142  TROPIPOC 0.00    BNP (last 3 results) No results for input(s): PROBNP in the last 8760 hours.  CBG:  Recent Labs Lab 11/18/15 1148  GLUCAP 109*     Radiological Exams on Admission: Ct Head Wo Contrast  11/18/2015  CLINICAL DATA:  CODE STROKE - PT very confused. Last seen normal at 2300 last night. Symptoms became worse about 10:30 am this morning./bbj EXAM: CT HEAD WITHOUT CONTRAST TECHNIQUE: Contiguous axial images were obtained from the base of the skull through the vertex without intravenous contrast. COMPARISON:  None. FINDINGS: Brain: No evidence of acute infarction, hemorrhage, extra-axial collection, ventriculomegaly, or mass effect. Vascular: No hyperdense vessel or unexpected calcification. Skull: Negative for fracture or focal lesion. Sinuses/Orbits: Partial opacification of the left maxillary sinus. Otherwise negative. Other: None. IMPRESSION: 1. Negative for bleed or other acute intracranial process. Critical Value/emergent results were called by telephone at the time of interpretation on 11/18/2015 at 11:31 am to Dr. Loren Racer , who verbally acknowledged these results. Electronically Signed   By: Corlis Leak M.D.   On: 11/18/2015 11:32      Active Problems:   Slurred  speech   Assessment/Plan 1. Weakness, slurred speech- complex migraine vs pyschogenic etiology. Patient attributes onset of symptoms to Linzess but previously noted adverse effects not neurological. Patient CT within normal limits- will transfer to [facility] for [facility]. Patient has history of migraine and under significant familiar stress 2. HTN: continue Prinzide at home dose 3. GERD: continue protonix 4. Depression/ anxiety: continue Wellbutrin (patient has previously discontinued this 3 days prior to admission)   Code Status: full  DVT prophylaxis: lovenox Family Communication: husband at bedside Disposition Plan/Anticipated LOS:  [facility]  Time spent: 35 minutes  Reuben Likes, MD  Resident Physician [facility]  11/18/2015, 2:03 PM

## 2015-11-18 NOTE — ED Notes (Signed)
MD at bedside. 

## 2015-11-18 NOTE — ED Notes (Addendum)
Went to bed at 2300 last night and pt was normal. Started with slurred speech about 1020.  Pt and husband says pt has been having symptoms for 2 weeks.  Pt having problems concentrating, loss train of thoughts.  Md notified.  Husband and wife confused about when symptoms started.  When asked about when slurred speech started husband says 1020 and then turns around and says its been going on for 2 weeks.  Then pt says she has been having chest pain during triage for 2 weeks on and off.  First she says left numbness arm and then changes it to right arm numbness.  Difficult to gets concrete answer from pt of husband.

## 2015-11-18 NOTE — ED Notes (Addendum)
Pt intermittently having slurred speech and confusion while in ER. Pt is aware of confusion while being evaluated and states that she is confused. Pt states that she has been experiencing dizziness and confusion X "a few weeks" husband confirms. Pt c/o right shoulder pain X "a year". Pt states that intermittent numbness of left arm comes and goes since this AM.  Pt is alert and oriented to self, place, situation and time. Pt is a poor historian.

## 2015-11-18 NOTE — ED Notes (Signed)
Pt in CT.

## 2015-11-18 NOTE — ED Notes (Signed)
MD assessed pt before being transported to CT. MD states to cancel code stroke but to take pt to CT for evaluation. Pt transported to CT by RN.

## 2015-11-18 NOTE — ED Notes (Signed)
Pt stating that she wants to go home and wants to sign out AMA. Pt is alert and oriented at this time with husband at bedside.

## 2015-11-18 NOTE — ED Notes (Signed)
Pt spoke with Dr. Jomarie Longs about wanting to leave AMA. Pt is refusing to stay at hospital. Pt is alert and oriented and ambulatory upon leaving hospital.

## 2015-11-18 NOTE — ED Notes (Signed)
Pt requesting to move IV location.    Pt unable to give urine specimen at this time. Verbal order for NS.

## 2015-11-18 NOTE — ED Provider Notes (Signed)
CSN: [id number]     Arrival date & time 11/18/15  1100 History  By signing my name below, I, Ronney Lion, attest that this documentation has been prepared under the direction and in the presence of Loren Racer, MD. Electronically Signed: Ronney Lion, ED Scribe. 11/18/2015. 1:35 PM.   Chief Complaint  Patient presents with  . Code Stroke  . Chest Pain   The history is provided by the patient and the spouse. No language interpreter was used.    HPI Comments: EARLINE STINER is a 50 y.o. female who presents to the Emergency Department with multiple complaints, including chest pain, left arm numbness, and speech difficulties that began about 1 hour ago, at 10:20 AM. Patient was seen normal by husband at 11:00 PM last night, before going to bed. Her husband notes that over the past 2 weeks, patient has had intermittent left arm numbness, but states that today, "she's the worst she has been." Her husband states her speech has been intermittently slurred and she has "not been herself," with difficulty concentrating. Patient's unable to give any details about chest pain. She denies any current pain. Denies any lower extremity swelling. Very poor historian.    Past Medical History  Diagnosis Date  . Migraines   . HTN (hypertension)   . Emphysema of lung (HCC)     on CXR  . Gastric ulcer    Past Surgical History  Procedure Laterality Date  . Cesarean section    . Esophagogastroduodenoscopy N/A 05/19/2015    SLF: 1. RUQ pain/nausea due to GERD/ PUD/ gastritits/ duodentiits 2. Monerate hiatal hernia.   Family History  Problem Relation Age of Onset  . Stroke Father   . Ulcers Father   . Colitis Father   . Colon cancer Neg Hx   . Liver disease Neg Hx   . Hypertension Mother   . Heart failure Mother    Social History  Substance Use Topics  . Smoking status: Current Every Day Smoker -- 1.00 packs/day for 36 years    Types: Cigarettes  . Smokeless tobacco: None     Comment: one pack daily   . Alcohol Use: No   OB History    Gravida Para Term Preterm AB TAB SAB Ectopic Multiple Living            5     Review of Systems  Constitutional: Negative for fever and chills.  Eyes: Negative for visual disturbance.  Respiratory: Negative for cough and shortness of breath.   Cardiovascular: Positive for chest pain.  Gastrointestinal: Negative for nausea, abdominal pain and diarrhea.  Musculoskeletal: Negative for back pain, neck pain and neck stiffness.  Skin: Negative for rash and wound.  Neurological: Positive for speech difficulty and numbness. Negative for weakness, light-headedness and headaches.  Psychiatric/Behavioral: Positive for behavioral problems and agitation.  All other systems reviewed and are negative.     Allergies  Review of patient's allergies indicates no known allergies.  Home Medications   Prior to Admission medications   Medication Sig Start Date End Date Taking? Authorizing Provider  acetaminophen (TYLENOL) 325 MG tablet Take 325 mg by mouth every 6 (six) hours as needed.   Yes Historical Provider, MD  ALBUTEROL IN Inhale into the lungs.   Yes Historical Provider, MD  buPROPion (WELLBUTRIN XL) 300 MG 24 hr tablet Take 300 mg by mouth daily.   Yes Historical Provider, MD  Linaclotide Karlene Einstein) 145 MCG CAPS capsule Take 1 capsule (145 mcg total) by mouth  daily. 10/26/15  Yes Tiffany Kocher, PA-C  lisinopril-hydrochlorothiazide (PRINZIDE,ZESTORETIC) 10-12.5 MG tablet Take 1 tablet by mouth daily.   Yes Historical Provider, MD  pantoprazole (PROTONIX) 40 MG tablet Take 1 tablet (40 mg total) by mouth 2 (two) times daily. One before breakfast and one at bedtime. 10/26/15  Yes Tiffany Kocher, PA-C  acetaminophen (TYLENOL) 650 MG CR tablet Take 650 mg by mouth every 8 (eight) hours as needed for pain.    Historical Provider, MD  diphenhydramine-acetaminophen (TYLENOL PM) 25-500 MG TABS tablet Take 2 tablets by mouth at bedtime. Reported on 11/18/2015    Historical  Provider, MD  NON FORMULARY Cold and Head Congestion    Historical Provider, MD   BP 113/87 mmHg  Pulse 72  Temp(Src) 98.9 F (37.2 C) (Oral)  Resp 18  Ht  (1.702 m)  Wt 190 lb (86.183 kg)  BMI 29.75 kg/m2  SpO2 92%  LMP 05/26/2014 Physical Exam  Constitutional: She is oriented to person, place, and time. She appears well-developed and well-nourished. No distress.  HENT:  Head: Normocephalic and atraumatic.  Mouth/Throat: Oropharynx is clear and moist. No oropharyngeal exudate.  Eyes: EOM are normal. Pupils are equal, round, and reactive to light.  Neck: Normal range of motion. Neck supple.  No meningismus  Cardiovascular: Normal rate and regular rhythm.  Exam reveals no gallop and no friction rub.   No murmur heard. Pulmonary/Chest: Effort normal and breath sounds normal. No respiratory distress. She has no wheezes. She has no rales. She exhibits no tenderness.  Abdominal: Soft. Bowel sounds are normal. She exhibits no distension and no mass. There is no tenderness. There is no rebound and no guarding.  Musculoskeletal: Normal range of motion. She exhibits no edema or tenderness.  Neurological: She is alert and oriented to person, place, and time.  Fine tremor noted. Patient is at times non-cooperative for exam and becomes agitated. Patient is alert and oriented x3 with clear, goal oriented speech. Patient has 5/5 motor in all extremities. Sensation is intact to light touch. Bilateral finger-to-nose is normal with no signs of dysmetria.  Skin: Skin is warm and dry. No rash noted. No erythema.  Psychiatric:  Confusion and easily agitated  Nursing note and vitals reviewed.   ED Course  Procedures (including critical care time)  DIAGNOSTIC STUDIES: Oxygen Saturation is 99% on RA, normal by my interpretation.    COORDINATION OF CARE: 11:17 AM - Discussed treatment plan with pt and her husband which includes CT head. Pt's husband verbalized understanding and agreed to plan.    Labs Review Labs Reviewed  CBC - Abnormal; Notable for the following:    RBC 5.42 (*)    Hemoglobin 16.5 (*)    HCT 48.1 (*)    All other components within normal limits  COMPREHENSIVE METABOLIC PANEL - Abnormal; Notable for the following:    Chloride 99 (*)    Glucose, Bld 113 (*)    AST 13 (*)    All other components within normal limits  ETHANOL - Abnormal; Notable for the following:    Alcohol, Ethyl (B) 5 (*)    All other components within normal limits  CBG MONITORING, ED - Abnormal; Notable for the following:    Glucose-Capillary 109 (*)    All other components within normal limits  I-STAT CHEM 8, ED - Abnormal; Notable for the following:    Chloride 98 (*)    Glucose, Bld 107 (*)    Hemoglobin 17.7 (*)  HCT 52.0 (*)    All other components within normal limits  PROTIME-INR  APTT  DIFFERENTIAL  URINE RAPID DRUG SCREEN, HOSP PERFORMED  I-STAT TROPOININ, ED    Imaging Review Ct Head Wo Contrast  11/18/2015  CLINICAL DATA:  CODE STROKE - PT very confused. Last seen normal at 2300 last night. Symptoms became worse about 10:30 am this morning./bbj EXAM: CT HEAD WITHOUT CONTRAST TECHNIQUE: Contiguous axial images were obtained from the base of the skull through the vertex without intravenous contrast. COMPARISON:  None. FINDINGS: Brain: No evidence of acute infarction, hemorrhage, extra-axial collection, ventriculomegaly, or mass effect. Vascular: No hyperdense vessel or unexpected calcification. Skull: Negative for fracture or focal lesion. Sinuses/Orbits: Partial opacification of the left maxillary sinus. Otherwise negative. Other: None. IMPRESSION: 1. Negative for bleed or other acute intracranial process. Critical Value/emergent results were called by telephone at the time of interpretation on 11/18/2015 at 11:31 am to Dr. Loren Racer , who verbally acknowledged these results. Electronically Signed   By: Corlis Leak M.D.   On: 11/18/2015 11:32   I have personally  reviewed and evaluated these images and lab results as part of my medical decision-making.   EKG Interpretation   Date/Time:  Saturday November 18 2015 11:30:55 EST Ventricular Rate:  93 PR Interval:  150 QRS Duration: 89 QT Interval:  359 QTC Calculation: 446 R Axis:   12 Text Interpretation:  Sinus rhythm Probable left atrial enlargement  Confirmed by Ranae Palms  MD, Breanna Mcdaniel ([id number]) on 11/18/2015 12:23:10 PM      MDM   Final diagnoses:  Slurred speech  Numbness and tingling in left arm  Irritability and anger    I personally performed the services described in this documentation, which was scribed in my presence. The recorded information has been reviewed and is accurate.    Patient with inconsistent presentation. No definite neurologic deficits noted. CT head is normal. Patient states that since starting Linzess symptoms have been worsening. Will admit for TIA/chest pain workup though symptoms likely psychogenic in origin.  Patient is to have a normal neurologic exam. Discussed with Dr. Jomarie Longs and will transport patient to [facility] for MRI and TIA workup.    Loren Racer, MD 11/18/15 1335

## 2015-11-18 NOTE — ED Notes (Signed)
MD assessment at this time. Patient to CT after.

## 2015-11-18 NOTE — ED Notes (Signed)
Pt verbalized she needed to go to the bathroom but unable to produce urine.  Pt placed back in bed and connected to monitor. NAD noted.

## 2015-11-18 NOTE — ED Notes (Signed)
Patient states she recently started Linzess around 10/26/15. MD notified.

## 2015-11-18 NOTE — ED Notes (Addendum)
MD evaluated pt in doctors office and states to call off code stroke.

## 2016-01-02 ENCOUNTER — Encounter: Payer: Self-pay | Admitting: Gastroenterology

## 2016-05-07 IMAGING — DX DG CHEST 2V
2 series · 2 of 2 positions shown · non-contrast
Comparison: Acute abdominal series 02/28/2015

CLINICAL DATA: Intermittent chest pain over the last 4 days. Pain
is positional. Vomiting and diarrhea.

EXAM:
CHEST - 2 VIEW

[chest pa]
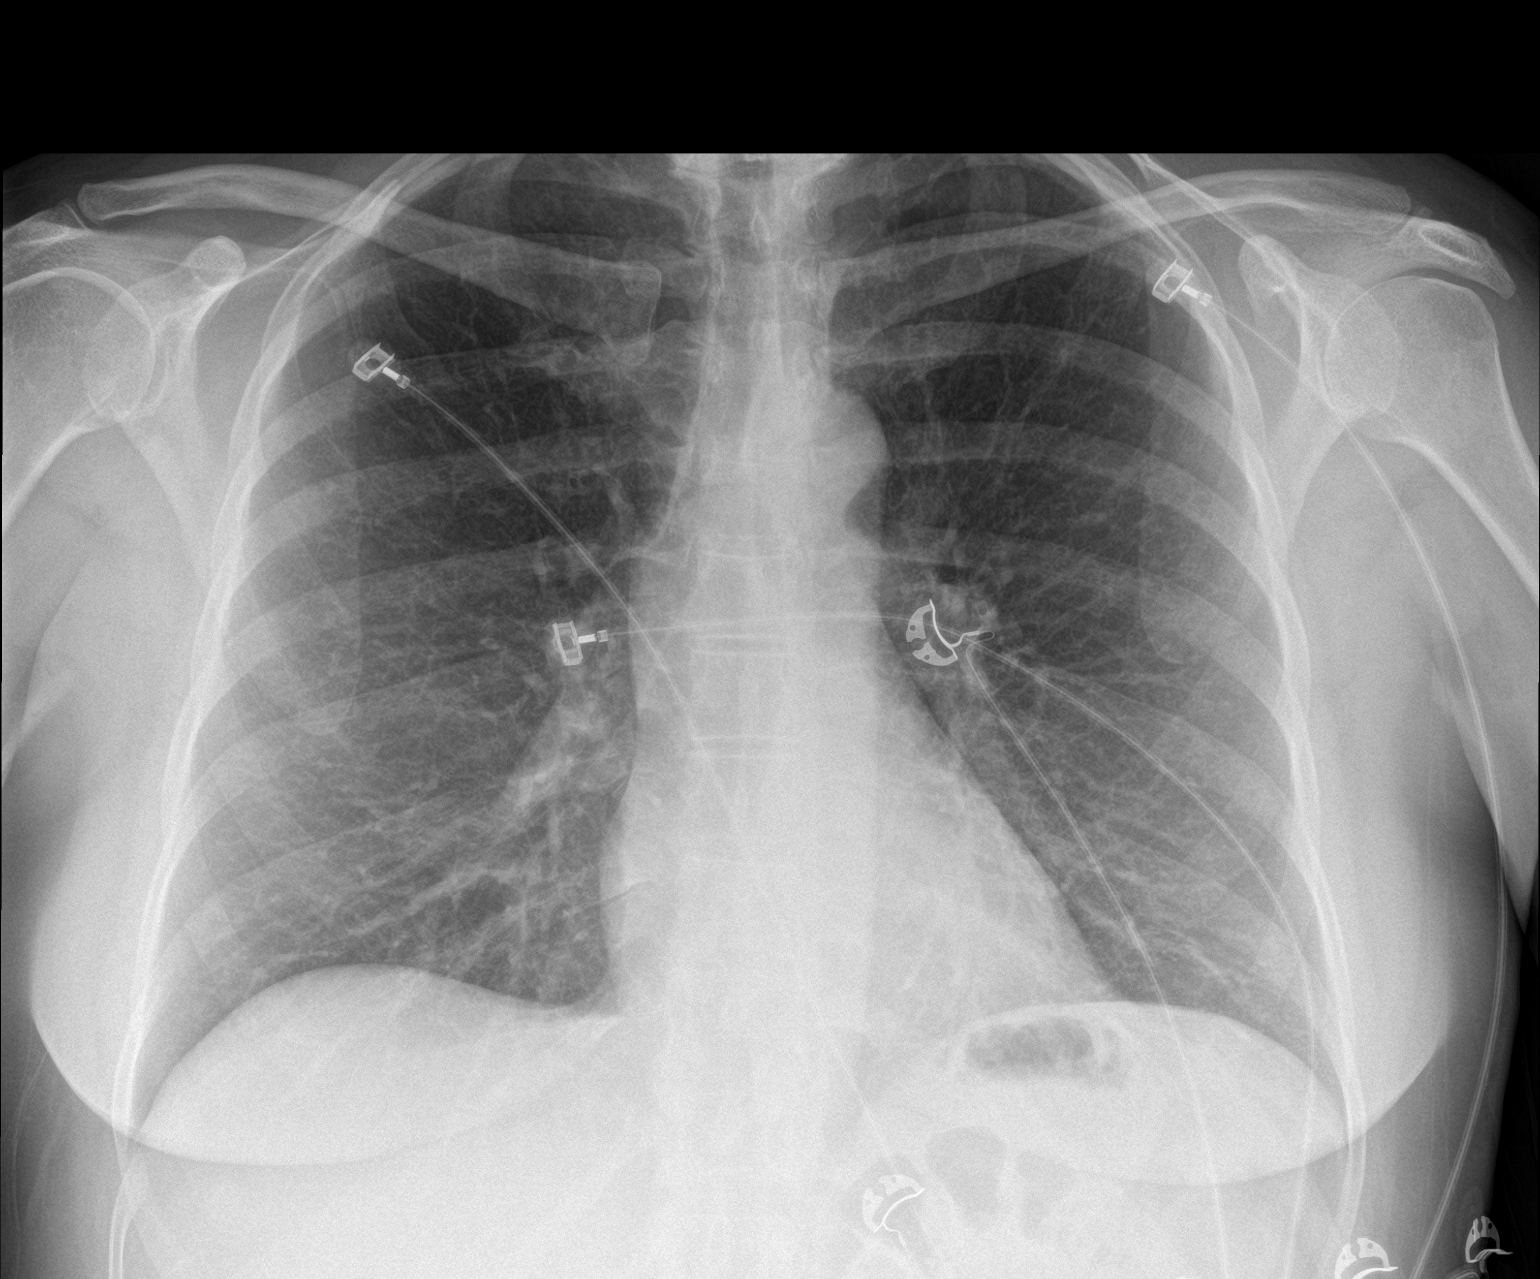

[chest lat]
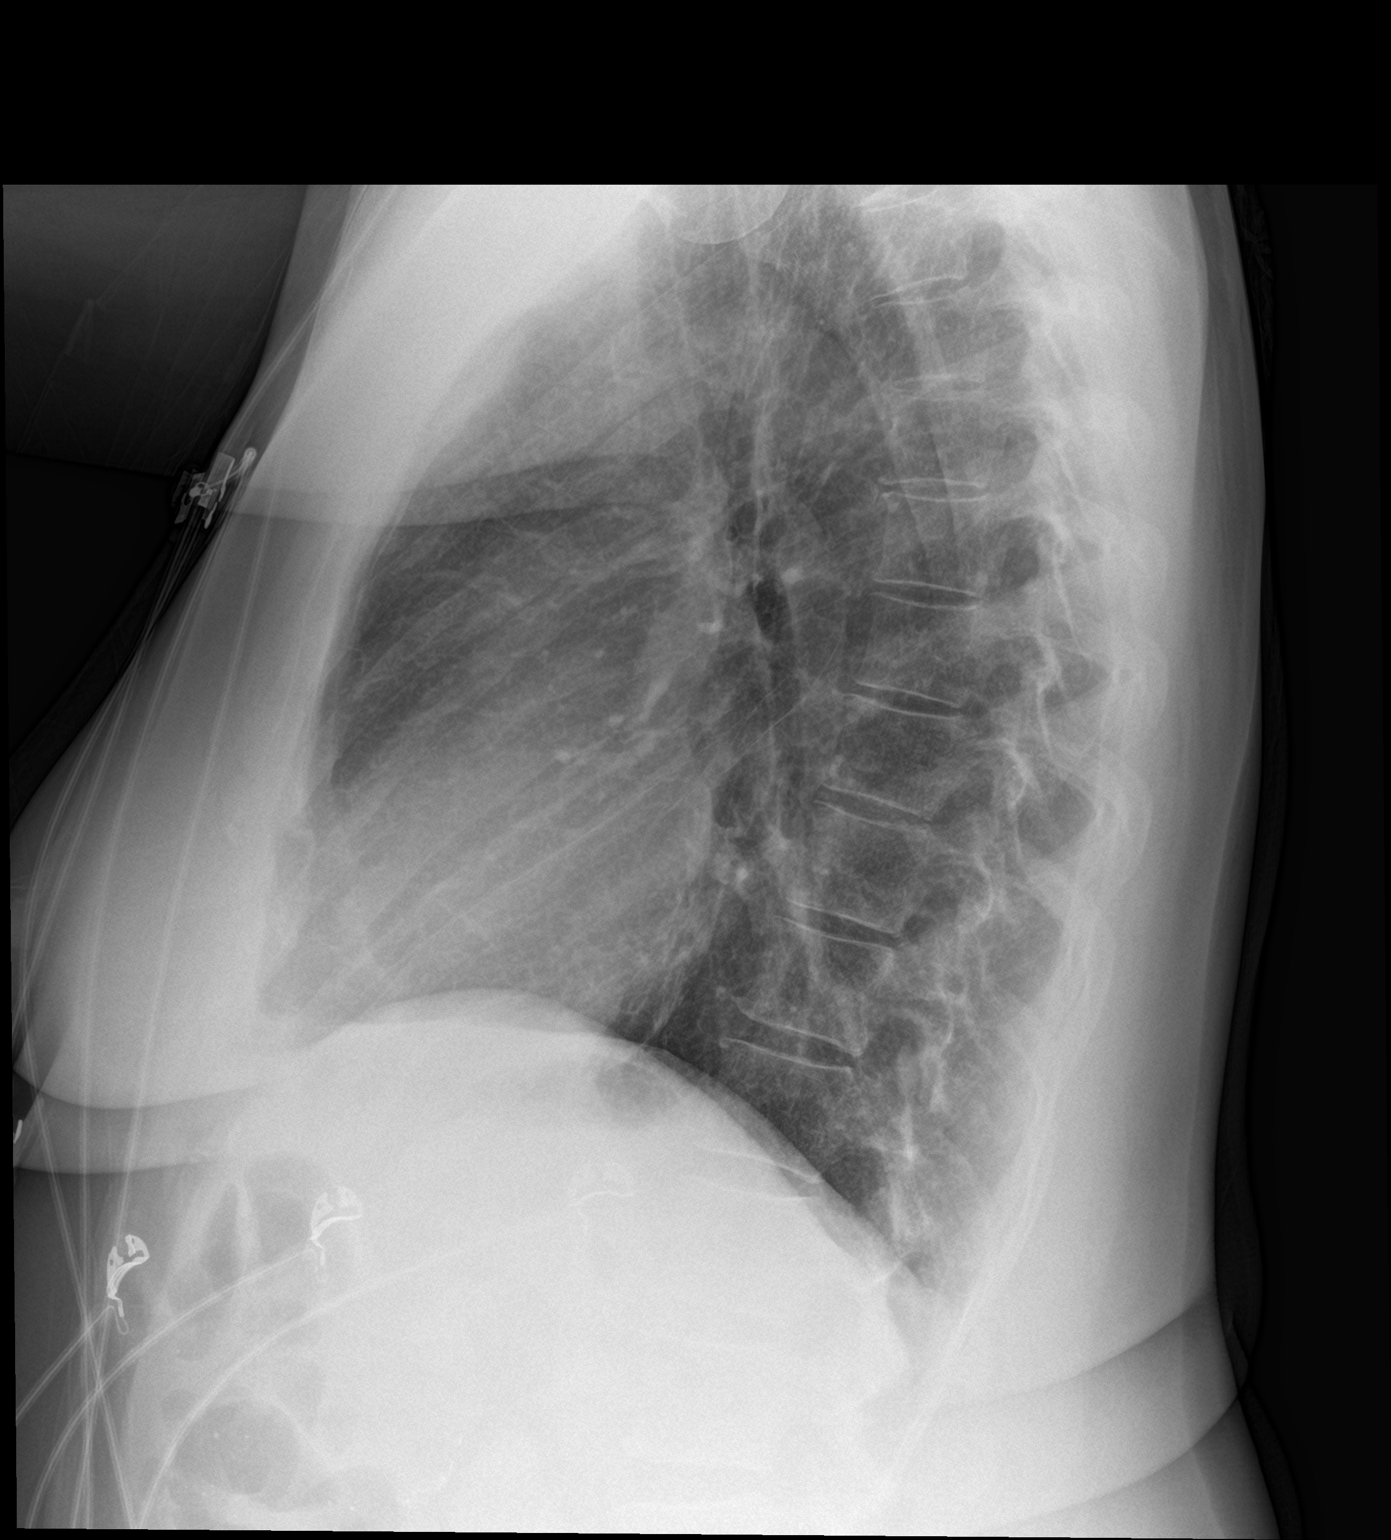

[2 of 2 positions shown; findings below may reference images not displayed]

FINDINGS: Heart size is normal. Chronic interstitial coarsening is similar to
the prior exam. No focal airspace disease is present. There is no
edema or effusion to suggest failure. The visualized soft tissues
and bony thorax are unremarkable.
IMPRESSION: 1. No acute cardiopulmonary disease.
2. Chronic interstitial coarsening is stable.

## 2016-05-22 ENCOUNTER — Telehealth: Payer: Self-pay | Admitting: Gastroenterology

## 2016-05-22 NOTE — Telephone Encounter (Signed)
Patient is overdue for OV with [facility] only for f/u eosinophilic gastritis, dilated CBD. Please offer OV.

## 2016-05-23 NOTE — Telephone Encounter (Signed)
I called pt and offered an OV and she said NO, not right now. She will call when she is ready.

## 2016-05-24 NOTE — Telephone Encounter (Signed)
noted 

## 2016-10-05 NOTE — Progress Notes (Signed)
REVIEWED-NO ADDITIONAL RECOMMENDATIONS. 

## 2017-01-07 IMAGING — CT CT HEAD W/O CM
1 of 2 series · 16 of 30 positions shown, 20 images · non-contrast
Comparison: None.

CLINICAL DATA: CODE STROKE - PT very confused. Last seen normal at
3644 last night. Symptoms became worse about [DATE] this
morning./bbj

EXAM:
CT HEAD WITHOUT CONTRAST
TECHNIQUE: Contiguous axial images were obtained from the base of the skull
through the vertex without intravenous contrast.

[Series 3: headtrauma 2.4 h60s · axial · 0.48mm/px · z∈[+101,+256]mm · 16 of 72 slices shown, 20 images]
[im 4/72  brain]
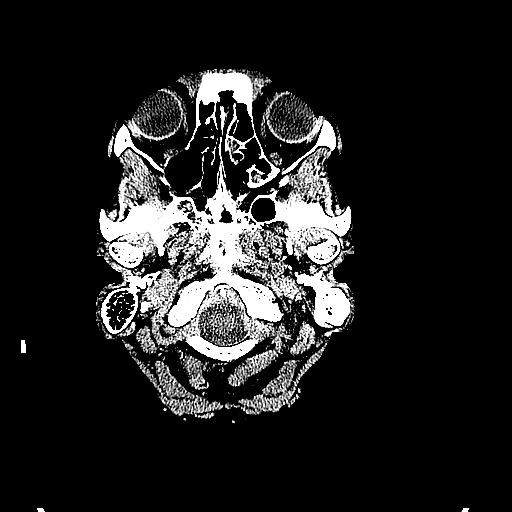
[im 4/72  bone]
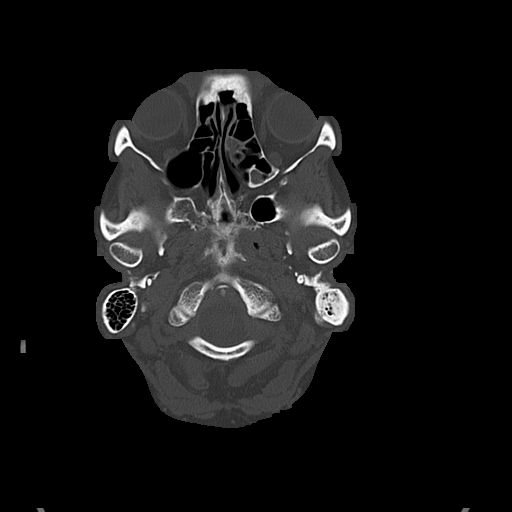
[im 8/72  brain]
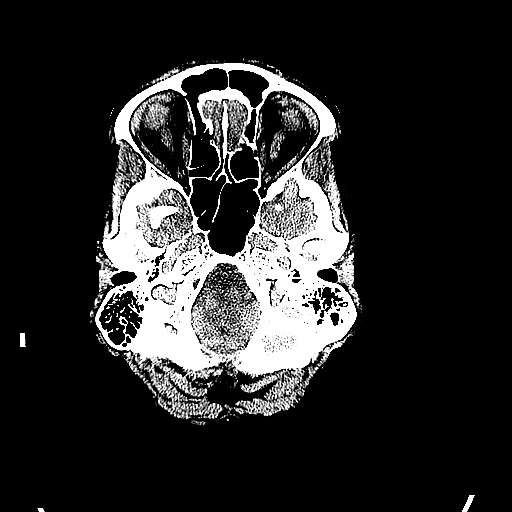
[im 12/72  brain]
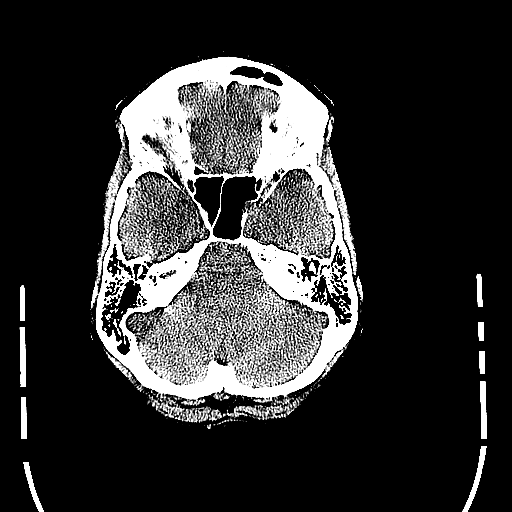
[im 15/72  brain]
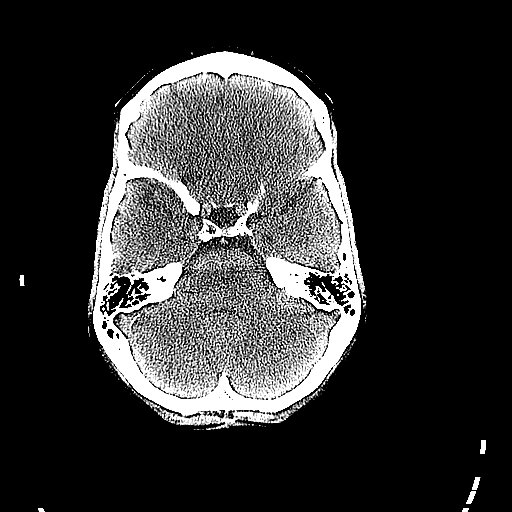
[im 23/72  brain]
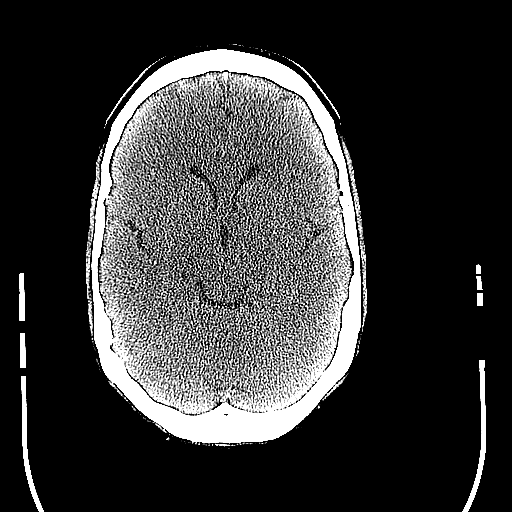
[im 23/72  bone]
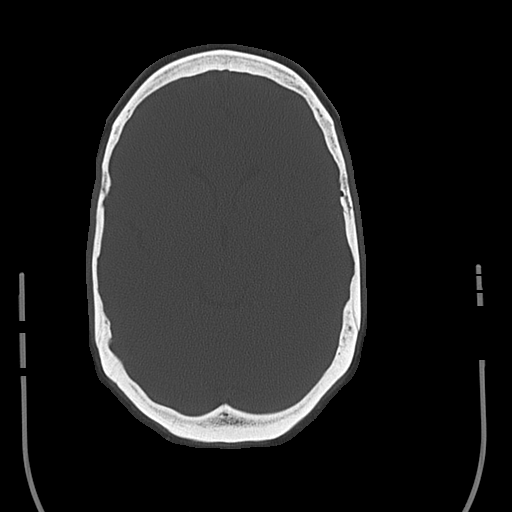
[im 27/72  brain]
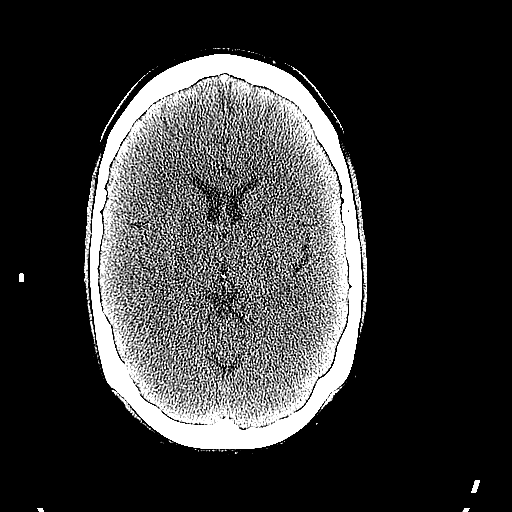
[im 30/72  brain]
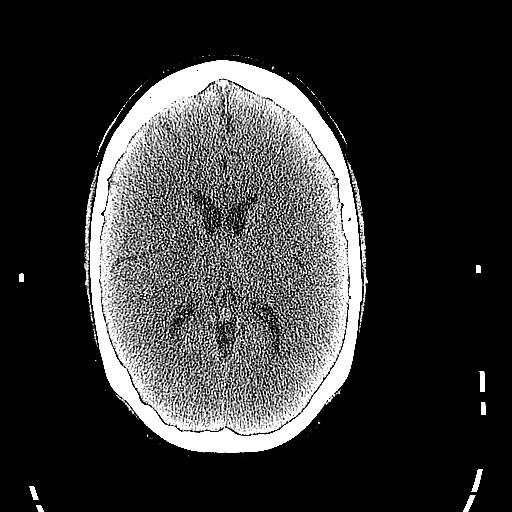
[im 34/72  brain]
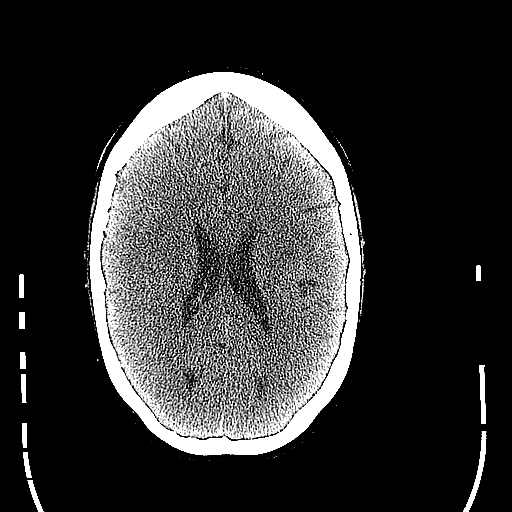
[im 38/72  brain]
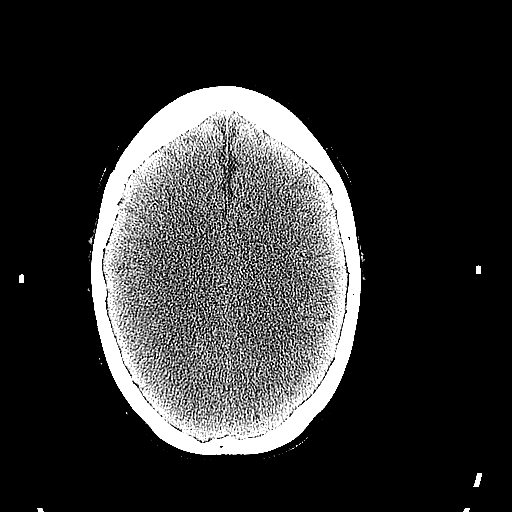
[im 38/72  bone]
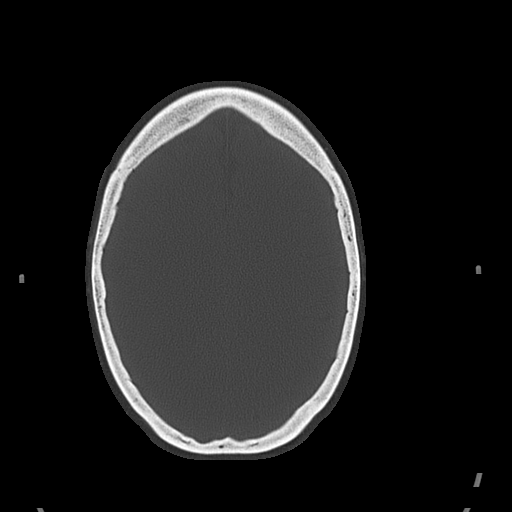
[im 42/72  brain]
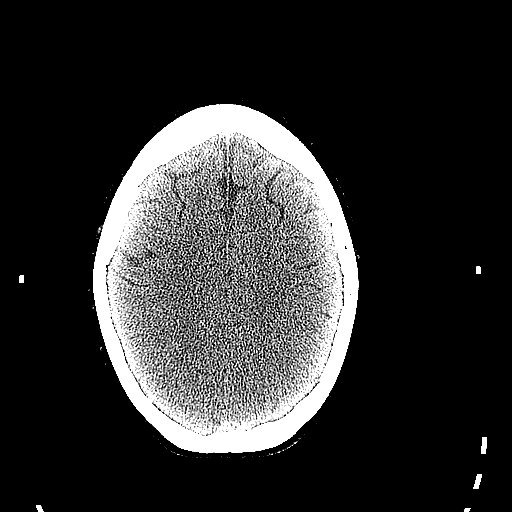
[im 45/72  brain]
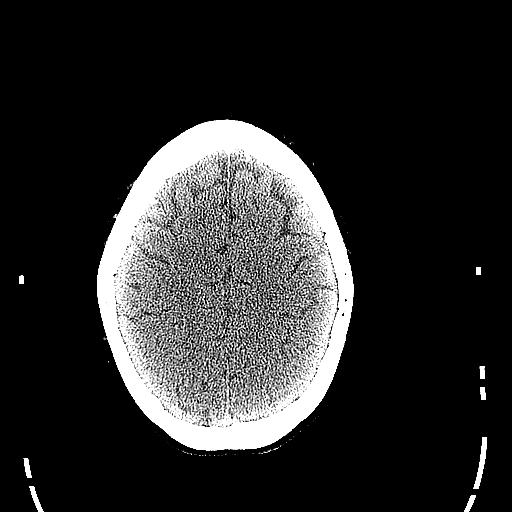
[im 49/72  brain]
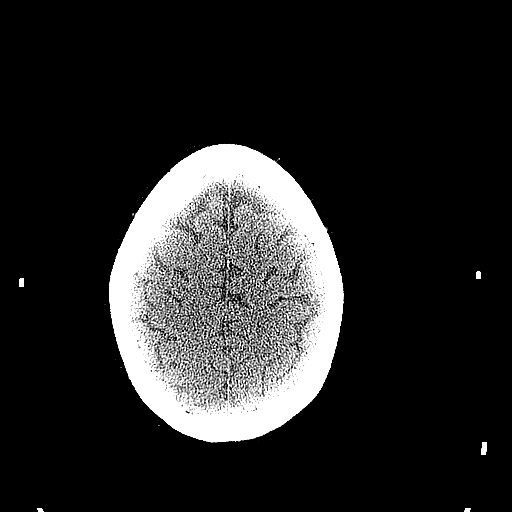
[im 57/72  brain]
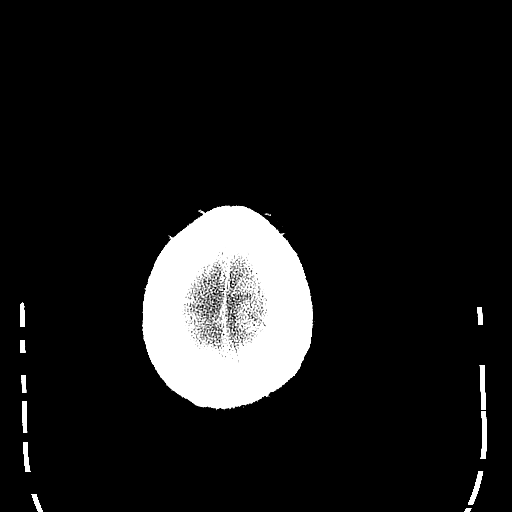
[im 57/72  bone]
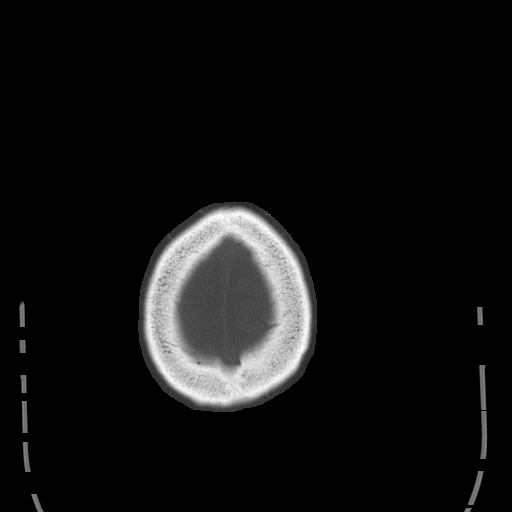
[im 60/72  brain]
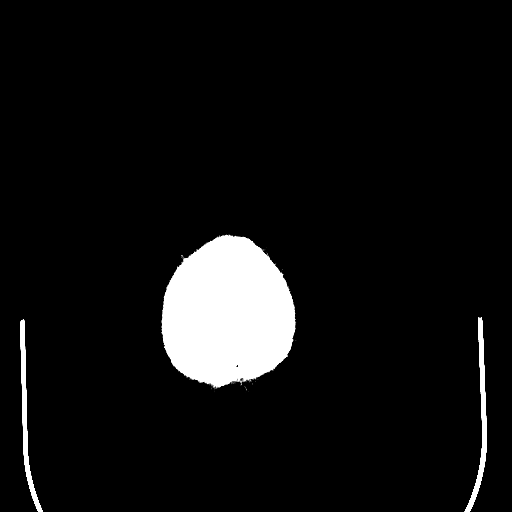
[im 64/72  brain]
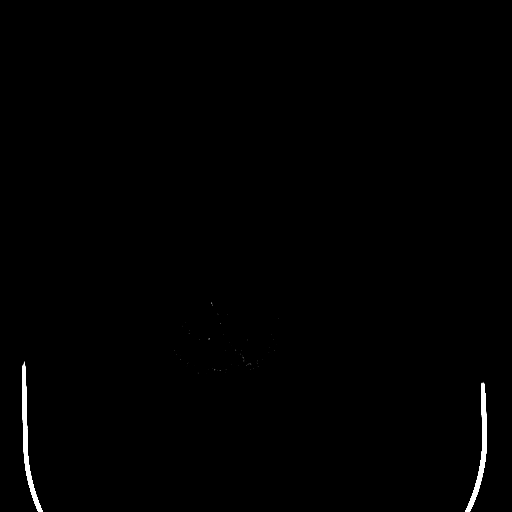
[im 68/72  brain]
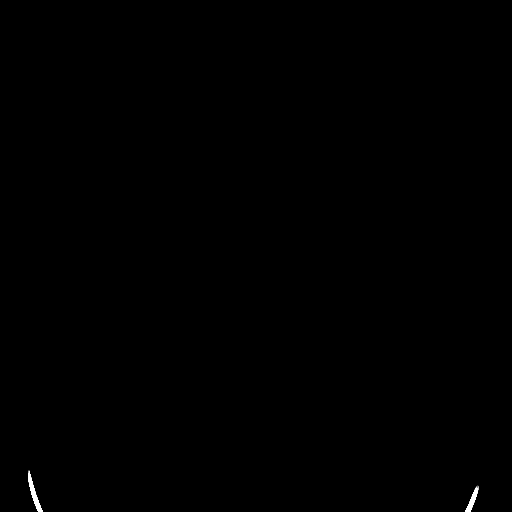

[16 of 30 positions shown; findings below may reference images not displayed]

FINDINGS: Brain: No evidence of acute infarction, hemorrhage, extra-axial
collection, ventriculomegaly, or mass effect.

Vascular: No hyperdense vessel or unexpected calcification.

Skull: Negative for fracture or focal lesion.

Sinuses/Orbits: Partial opacification of the left maxillary sinus.
Otherwise negative.

Other: None.
IMPRESSION: 1. Negative for bleed or other acute intracranial process.

Critical Value/emergent results were called by telephone at the time
of interpretation on 11/18/2015 at [DATE] to Dr. GAMA ZANDERS ,
who verbally acknowledged these results.

## 2017-05-27 IMAGING — US US ABDOMEN COMPLETE
1 series · 14 of 25 positions shown · non-contrast
Comparison: None.

CLINICAL DATA: Right-sided abdominal pain. Recent nausea and
vomiting.

EXAM:
ULTRASOUND ABDOMEN COMPLETE

[Series 1: us abdomen complete · 0.20mm/px · 14 of 139 slices shown]
[im 1/139]
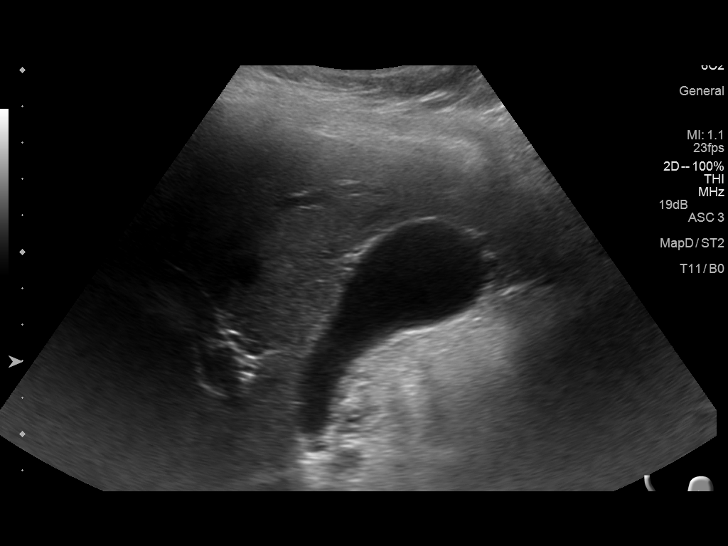
[im 12/139]
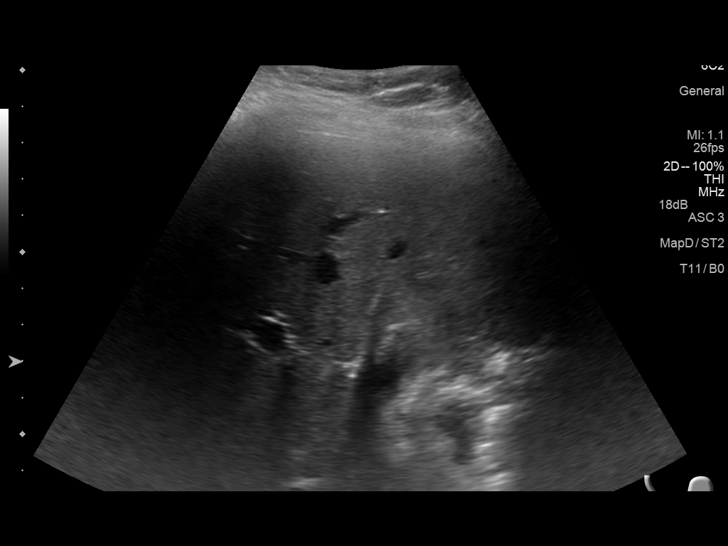
[im 24/139]
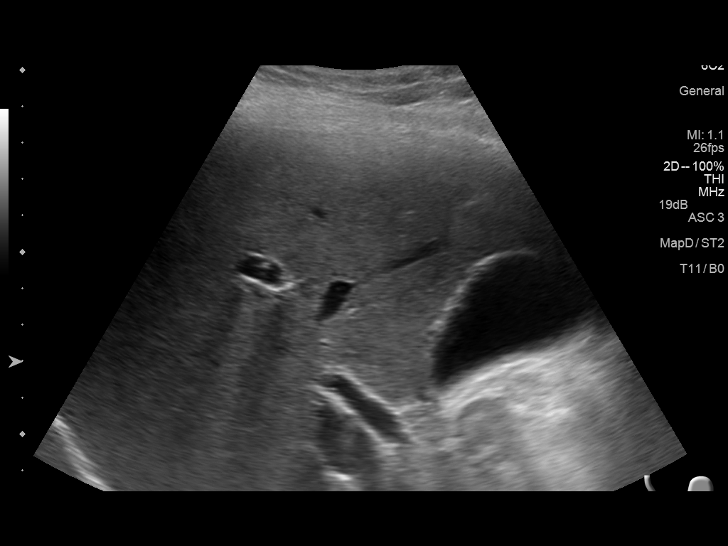
[im 35/139]
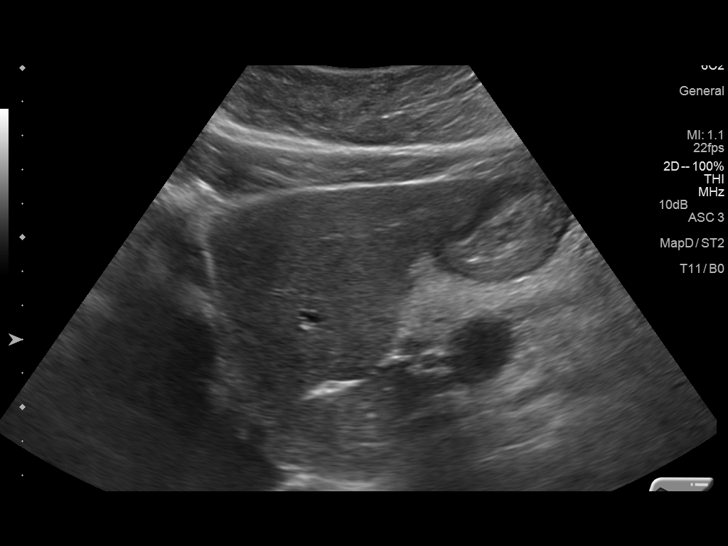
[im 47/139]
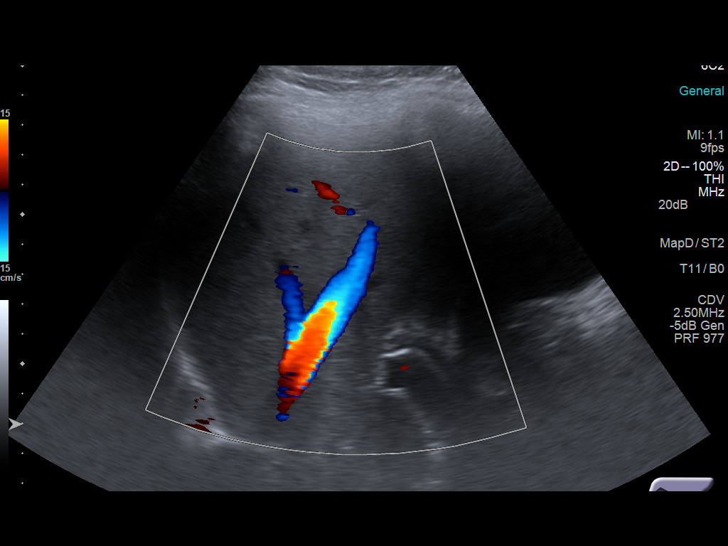
[im 52/139]
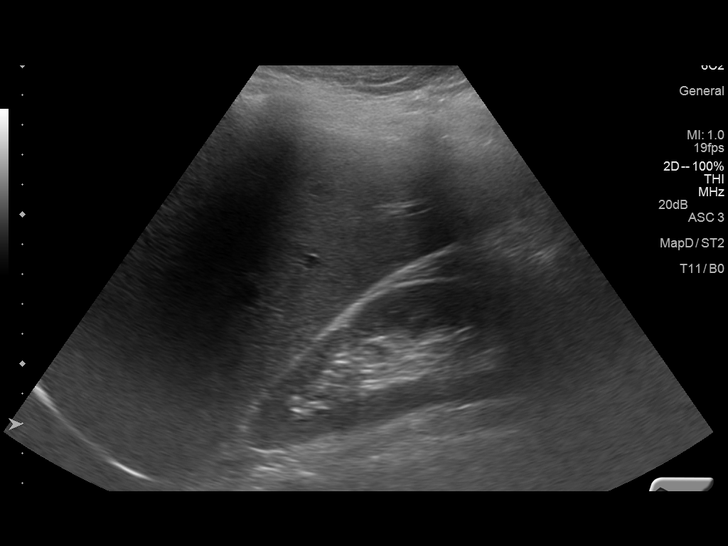
[im 64/139]
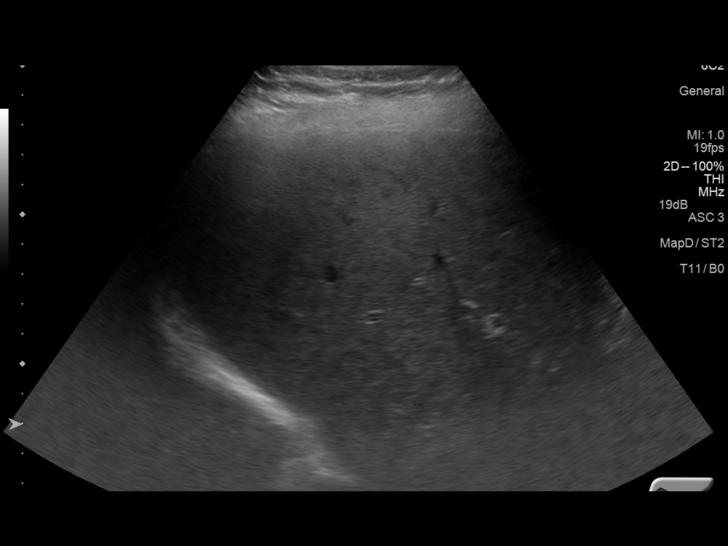
[im 75/139]
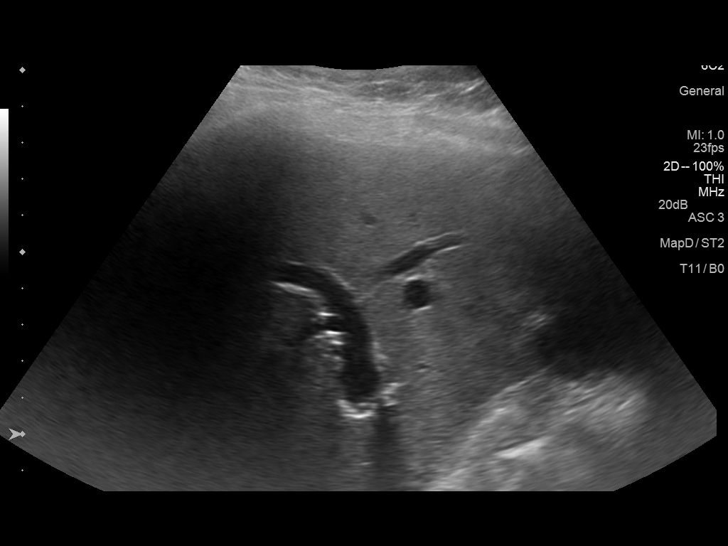
[im 87/139]
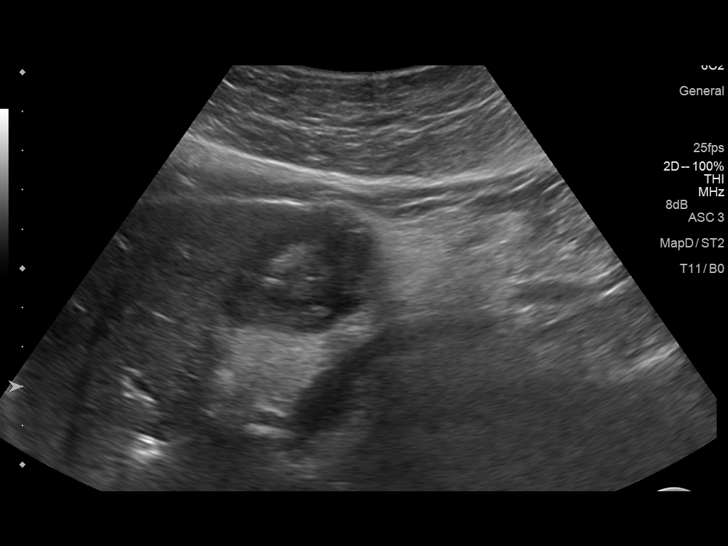
[im 93/139]
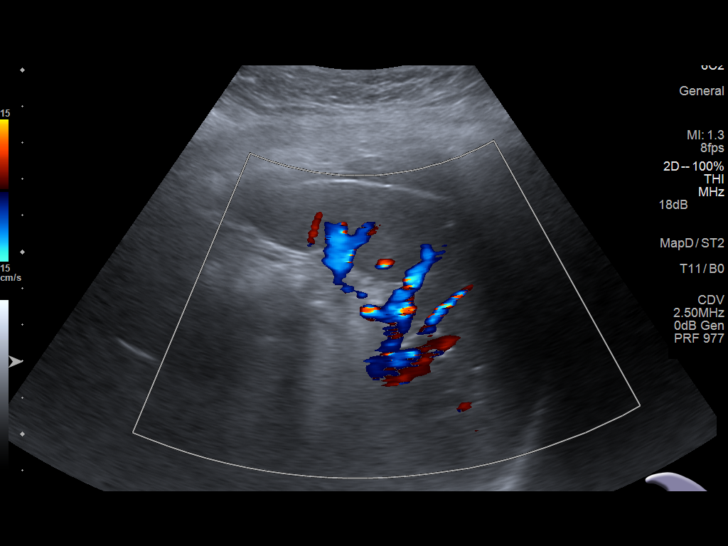
[im 104/139]
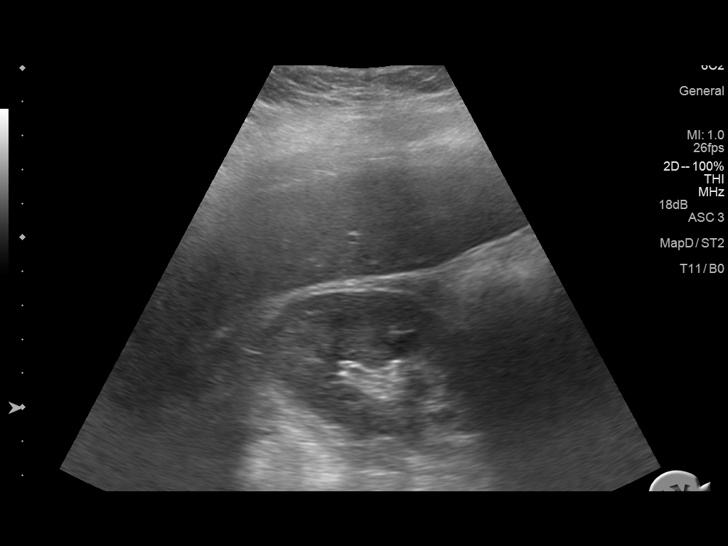
[im 116/139]
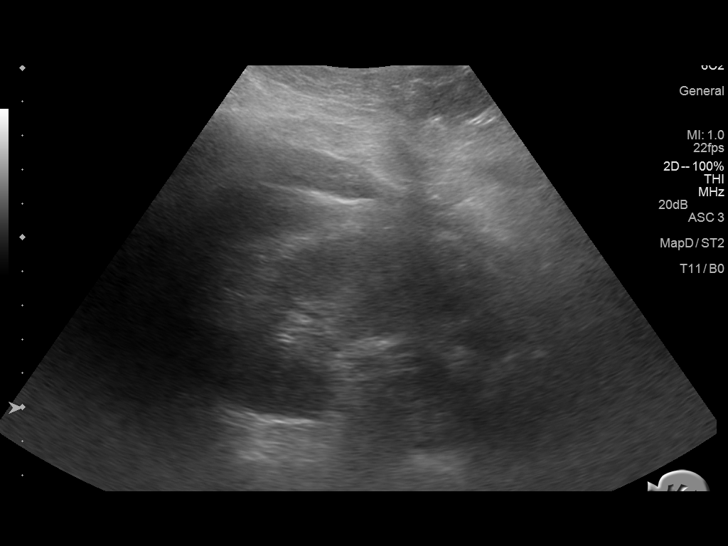
[im 127/139]
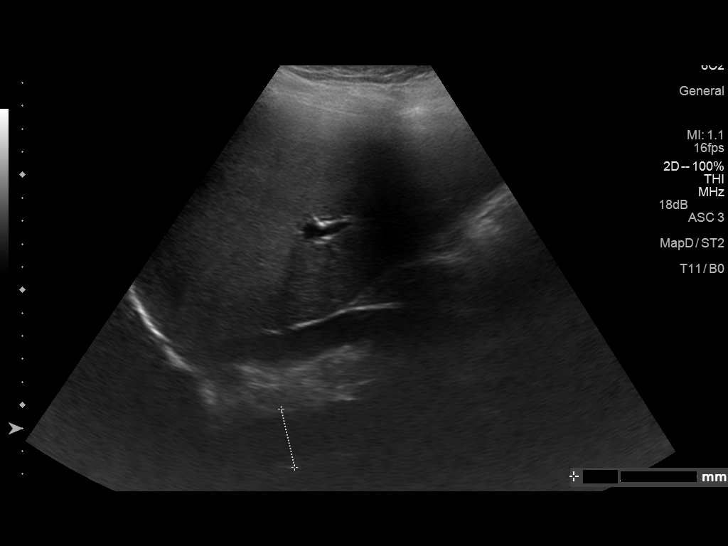
[im 139/139]
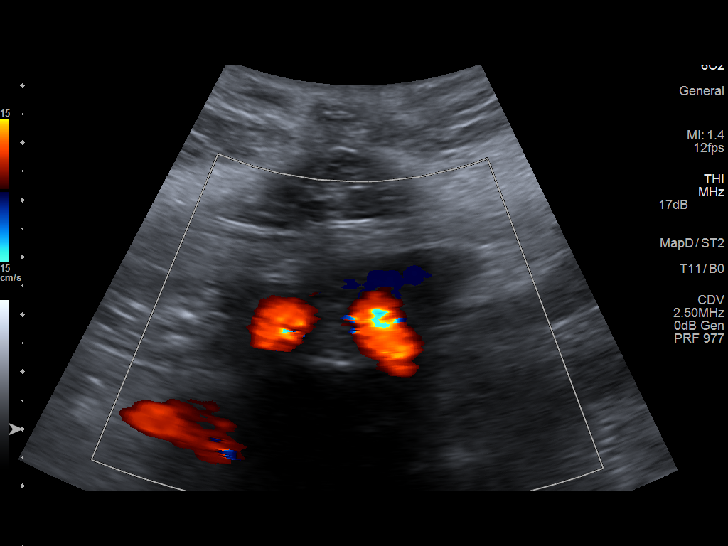

[14 of 25 positions shown; findings below may reference images not displayed]

FINDINGS: Gallbladder: No gallstones or wall thickening visualized. No
sonographic Murphy sign noted.

Common bile duct: Diameter: 7.7 mm, slightly prominent. No visible
stones. No visible dilated intrahepatic bile ducts.

Liver: No focal lesion identified. Within normal limits in
parenchymal echogenicity.

IVC: No abnormality visualized.

Pancreas: Visualized portion unremarkable.

Spleen: Size and appearance within normal limits.  8.9 cm.

Right Kidney: Length: 13.0 cm. Echogenicity within normal limits. No
mass or hydronephrosis visualized.

Left Kidney: Length: 11.7 cm. Echogenicity within normal limits. No
mass or hydronephrosis visualized.

Abdominal aorta: No aneurysm visualized.  2.6 cm maximal diameter.

Other findings: None.
IMPRESSION: Prominence of the common bile duct to a diameter 7.7 mm. Is the
patient's bilirubin elevated?

## 2019-02-09 NOTE — Progress Notes (Signed)
REVIEWED-NO ADDITIONAL RECOMMENDATIONS. 

## 2019-06-18 ENCOUNTER — Other Ambulatory Visit (HOSPITAL_COMMUNITY): Payer: Self-pay | Admitting: Internal Medicine

## 2019-06-18 ENCOUNTER — Other Ambulatory Visit: Payer: Self-pay

## 2019-06-18 ENCOUNTER — Ambulatory Visit (HOSPITAL_COMMUNITY)
Admission: RE | Admit: 2019-06-18 | Discharge: 2019-06-18 | Disposition: A | Discharge status: Home / Self Care | Payer: Managed Care, Other (non HMO) | Source: Ambulatory Visit | Attending: Internal Medicine | Admitting: Internal Medicine

## 2019-06-18 DIAGNOSIS — R160 Hepatomegaly, not elsewhere classified: Secondary | ICD-10-CM

## 2019-06-18 DIAGNOSIS — R748 Abnormal levels of other serum enzymes: Secondary | ICD-10-CM
